# Patient Record
Sex: Male | Born: 1945 | Race: White | Hispanic: No | Marital: Married | Smoking: Never smoker
Health system: Southern US, Community
[De-identification: ages and names within clinical notes are randomized; demographics above are authoritative.]

## PROBLEM LIST (undated history)

## (undated) DIAGNOSIS — C801 Malignant (primary) neoplasm, unspecified: Secondary | ICD-10-CM

## (undated) DIAGNOSIS — R7303 Prediabetes: Secondary | ICD-10-CM

## (undated) DIAGNOSIS — R011 Cardiac murmur, unspecified: Secondary | ICD-10-CM

## (undated) DIAGNOSIS — I10 Essential (primary) hypertension: Secondary | ICD-10-CM

## (undated) HISTORY — PX: CYST EXCISION: SHX5701

## (undated) HISTORY — PX: HERNIA REPAIR: SHX51

---

## 1999-04-01 ENCOUNTER — Ambulatory Visit (HOSPITAL_BASED_OUTPATIENT_CLINIC_OR_DEPARTMENT_OTHER): Admission: RE | Admit: 1999-04-01 | Discharge: 1999-04-01 | Payer: Self-pay | Admitting: Surgery

## 2013-03-04 ENCOUNTER — Other Ambulatory Visit (HOSPITAL_COMMUNITY): Payer: Self-pay | Admitting: Urology

## 2013-03-04 DIAGNOSIS — R972 Elevated prostate specific antigen [PSA]: Secondary | ICD-10-CM

## 2013-03-18 ENCOUNTER — Ambulatory Visit (HOSPITAL_COMMUNITY)
Admission: RE | Admit: 2013-03-18 | Discharge: 2013-03-18 | Disposition: A | Discharge status: Home / Self Care | Payer: Medicare Other | Source: Ambulatory Visit | Attending: Urology | Admitting: Urology

## 2013-03-18 ENCOUNTER — Other Ambulatory Visit (HOSPITAL_COMMUNITY): Payer: Self-pay | Admitting: Urology

## 2013-03-18 DIAGNOSIS — R972 Elevated prostate specific antigen [PSA]: Secondary | ICD-10-CM | POA: Insufficient documentation

## 2013-03-18 DIAGNOSIS — N402 Nodular prostate without lower urinary tract symptoms: Secondary | ICD-10-CM | POA: Insufficient documentation

## 2013-03-18 DIAGNOSIS — Z9889 Other specified postprocedural states: Secondary | ICD-10-CM

## 2013-03-18 DIAGNOSIS — N400 Enlarged prostate without lower urinary tract symptoms: Secondary | ICD-10-CM | POA: Insufficient documentation

## 2013-03-18 DIAGNOSIS — N3289 Other specified disorders of bladder: Secondary | ICD-10-CM | POA: Insufficient documentation

## 2013-03-18 DIAGNOSIS — K5730 Diverticulosis of large intestine without perforation or abscess without bleeding: Secondary | ICD-10-CM | POA: Insufficient documentation

## 2013-03-18 LAB — POCT I-STAT, CHEM 8
Chloride: 102 mEq/L (ref 96–112)
Glucose, Bld: 97 mg/dL (ref 70–99)
Hemoglobin: 17 g/dL (ref 13.0–17.0)
Potassium: 3.8 mEq/L (ref 3.5–5.1)
Sodium: 143 mEq/L (ref 135–145)

## 2013-03-18 IMAGING — CR DG ORBITS FOR FOREIGN BODY
2 series · 2 of 2 positions shown · non-contrast
Comparison: None.

ADDENDUM:
Please note the following correction to the written report.
CLINICAL DATA: Pre MRI, question metal in eye.
CLINICAL DATA: Pre MRI, question muscle and 9.

EXAM:
ORBITS FOR FOREIGN BODY - 2 VIEW

[w waters (1 of 2)]
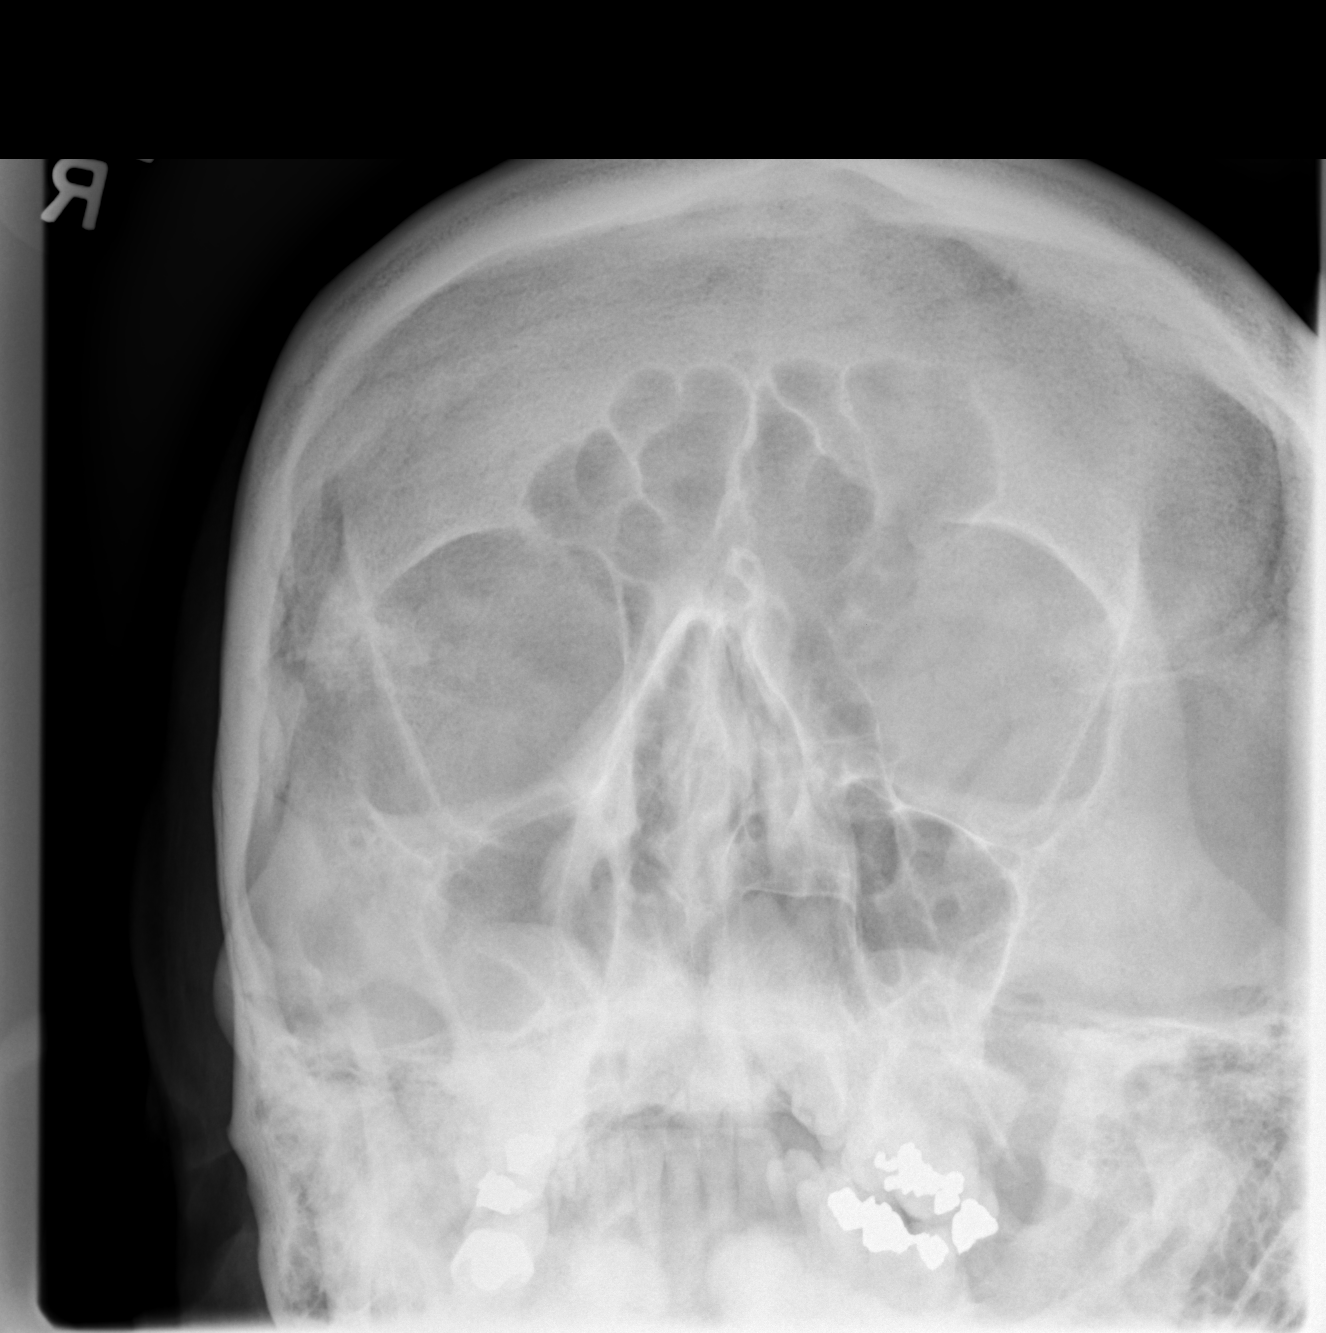

[w waters (2 of 2)]
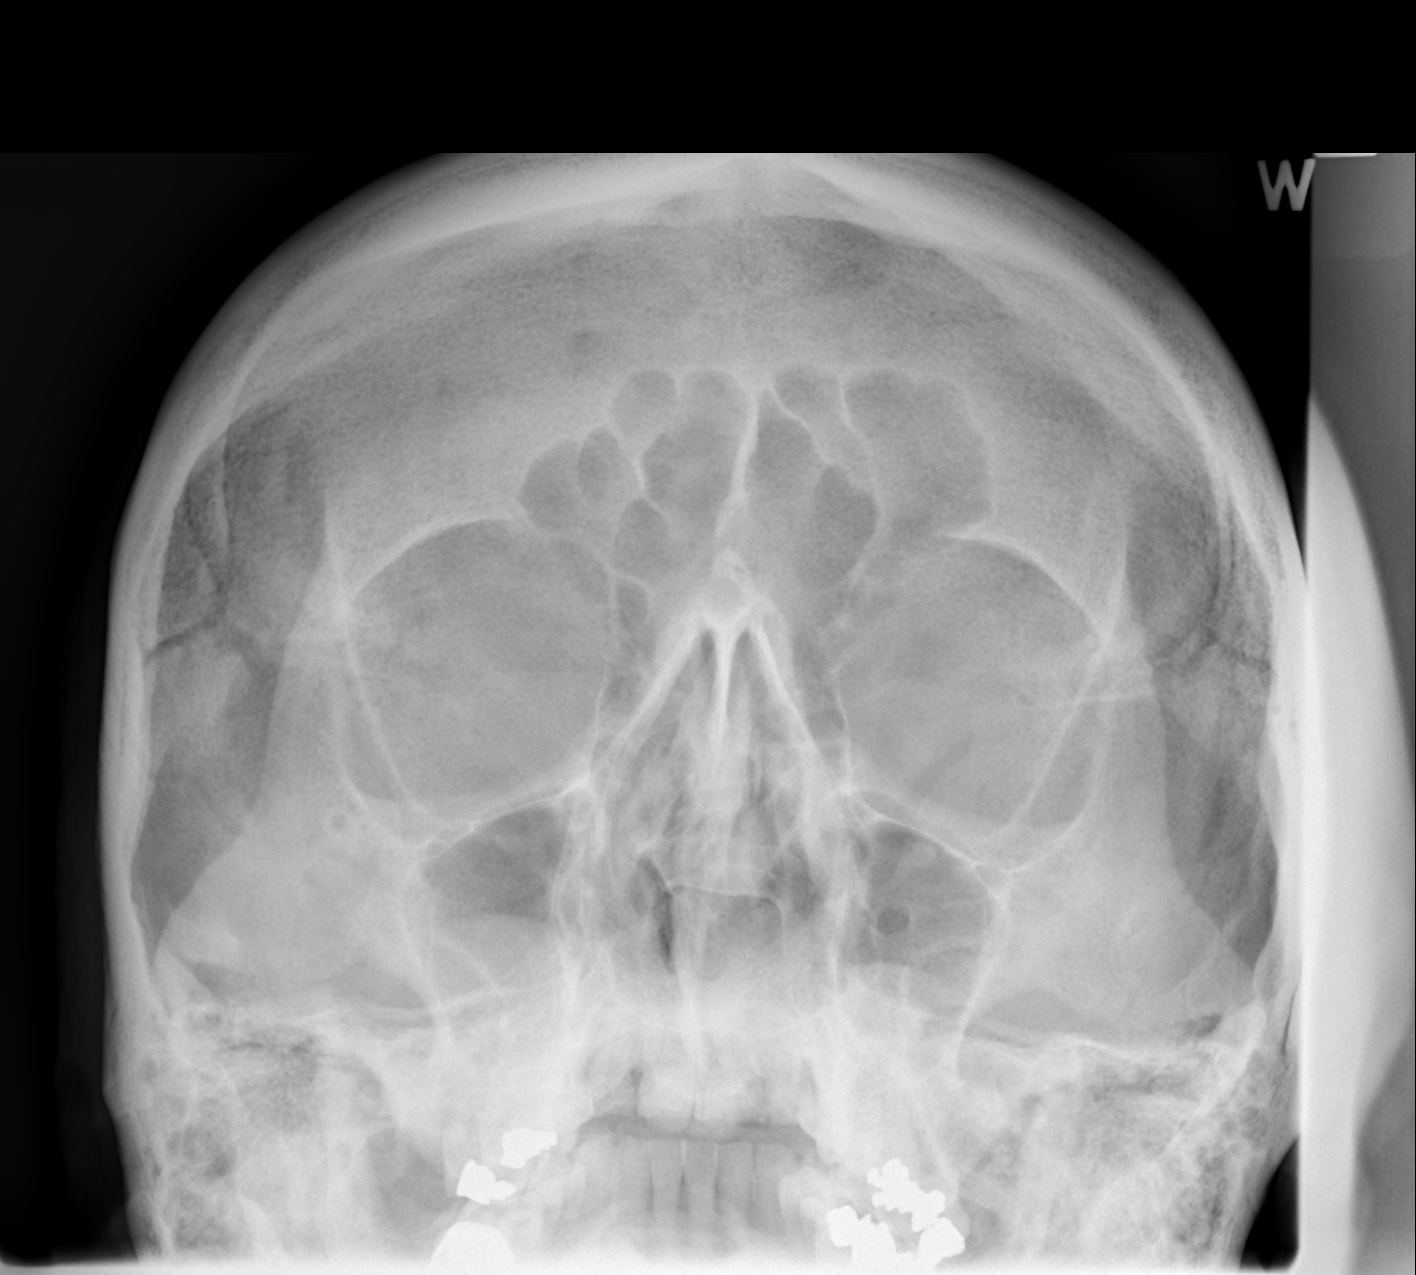

[2 of 2 positions shown; findings below may reference images not displayed]

FINDINGS: No orbital metallic foreign body.
IMPRESSION: No orbital metallic foreign body.

## 2013-03-18 MED ORDER — GADOBENATE DIMEGLUMINE 529 MG/ML IV SOLN
20.0000 mL | Freq: Once | INTRAVENOUS | Status: AC | PRN
  Administered 2013-03-18: 17 mL via INTRAVENOUS

## 2014-10-22 ENCOUNTER — Other Ambulatory Visit: Payer: Self-pay | Admitting: Urology

## 2014-10-22 DIAGNOSIS — C61 Malignant neoplasm of prostate: Secondary | ICD-10-CM

## 2014-11-10 ENCOUNTER — Ambulatory Visit (HOSPITAL_COMMUNITY)
Admission: RE | Admit: 2014-11-10 | Discharge: 2014-11-10 | Disposition: A | Discharge status: Home / Self Care | Payer: Medicare Other | Source: Ambulatory Visit | Attending: Urology | Admitting: Urology

## 2014-11-10 DIAGNOSIS — C61 Malignant neoplasm of prostate: Secondary | ICD-10-CM

## 2014-11-10 LAB — POCT I-STAT CREATININE: CREATININE: 1 mg/dL (ref 0.61–1.24)

## 2014-11-10 MED ORDER — GADOBENATE DIMEGLUMINE 529 MG/ML IV SOLN
20.0000 mL | Freq: Once | INTRAVENOUS | Status: AC | PRN
  Administered 2014-11-10: 18 mL via INTRAVENOUS

## 2015-04-05 DIAGNOSIS — N138 Other obstructive and reflux uropathy: Secondary | ICD-10-CM | POA: Insufficient documentation

## 2015-05-01 DIAGNOSIS — X32XXXD Exposure to sunlight, subsequent encounter: Secondary | ICD-10-CM | POA: Diagnosis not present

## 2015-05-01 DIAGNOSIS — Z1283 Encounter for screening for malignant neoplasm of skin: Secondary | ICD-10-CM | POA: Diagnosis not present

## 2015-05-01 DIAGNOSIS — D225 Melanocytic nevi of trunk: Secondary | ICD-10-CM | POA: Diagnosis not present

## 2015-05-01 DIAGNOSIS — L570 Actinic keratosis: Secondary | ICD-10-CM | POA: Diagnosis not present

## 2015-05-04 DIAGNOSIS — C61 Malignant neoplasm of prostate: Secondary | ICD-10-CM | POA: Diagnosis not present

## 2015-05-06 DIAGNOSIS — N401 Enlarged prostate with lower urinary tract symptoms: Secondary | ICD-10-CM | POA: Diagnosis not present

## 2015-05-06 DIAGNOSIS — Z0000 Encounter for general adult medical examination without abnormal findings: Secondary | ICD-10-CM | POA: Diagnosis not present

## 2015-05-06 DIAGNOSIS — C61 Malignant neoplasm of prostate: Secondary | ICD-10-CM | POA: Diagnosis not present

## 2015-05-06 DIAGNOSIS — N138 Other obstructive and reflux uropathy: Secondary | ICD-10-CM | POA: Diagnosis not present

## 2015-10-22 DIAGNOSIS — Z0000 Encounter for general adult medical examination without abnormal findings: Secondary | ICD-10-CM | POA: Diagnosis not present

## 2015-10-22 DIAGNOSIS — Z86010 Personal history of colonic polyps: Secondary | ICD-10-CM | POA: Diagnosis not present

## 2015-10-22 DIAGNOSIS — E7800 Pure hypercholesterolemia, unspecified: Secondary | ICD-10-CM | POA: Diagnosis not present

## 2015-10-22 DIAGNOSIS — L570 Actinic keratosis: Secondary | ICD-10-CM | POA: Diagnosis not present

## 2015-10-22 DIAGNOSIS — Z8546 Personal history of malignant neoplasm of prostate: Secondary | ICD-10-CM | POA: Diagnosis not present

## 2015-10-22 DIAGNOSIS — I10 Essential (primary) hypertension: Secondary | ICD-10-CM | POA: Diagnosis not present

## 2015-11-18 DIAGNOSIS — C61 Malignant neoplasm of prostate: Secondary | ICD-10-CM | POA: Diagnosis not present

## 2016-01-06 DIAGNOSIS — C61 Malignant neoplasm of prostate: Secondary | ICD-10-CM | POA: Diagnosis not present

## 2016-01-14 DIAGNOSIS — M5442 Lumbago with sciatica, left side: Secondary | ICD-10-CM | POA: Diagnosis not present

## 2016-01-14 DIAGNOSIS — M5136 Other intervertebral disc degeneration, lumbar region: Secondary | ICD-10-CM | POA: Diagnosis not present

## 2016-01-14 DIAGNOSIS — M9903 Segmental and somatic dysfunction of lumbar region: Secondary | ICD-10-CM | POA: Diagnosis not present

## 2016-01-14 DIAGNOSIS — M47816 Spondylosis without myelopathy or radiculopathy, lumbar region: Secondary | ICD-10-CM | POA: Diagnosis not present

## 2016-01-19 DIAGNOSIS — M47816 Spondylosis without myelopathy or radiculopathy, lumbar region: Secondary | ICD-10-CM | POA: Diagnosis not present

## 2016-01-19 DIAGNOSIS — M5442 Lumbago with sciatica, left side: Secondary | ICD-10-CM | POA: Diagnosis not present

## 2016-01-19 DIAGNOSIS — M9903 Segmental and somatic dysfunction of lumbar region: Secondary | ICD-10-CM | POA: Diagnosis not present

## 2016-01-19 DIAGNOSIS — M5136 Other intervertebral disc degeneration, lumbar region: Secondary | ICD-10-CM | POA: Diagnosis not present

## 2016-01-20 DIAGNOSIS — M545 Low back pain: Secondary | ICD-10-CM | POA: Diagnosis not present

## 2016-01-21 DIAGNOSIS — M9903 Segmental and somatic dysfunction of lumbar region: Secondary | ICD-10-CM | POA: Diagnosis not present

## 2016-01-21 DIAGNOSIS — M5442 Lumbago with sciatica, left side: Secondary | ICD-10-CM | POA: Diagnosis not present

## 2016-01-21 DIAGNOSIS — M47816 Spondylosis without myelopathy or radiculopathy, lumbar region: Secondary | ICD-10-CM | POA: Diagnosis not present

## 2016-01-21 DIAGNOSIS — M5136 Other intervertebral disc degeneration, lumbar region: Secondary | ICD-10-CM | POA: Diagnosis not present

## 2016-02-02 DIAGNOSIS — M5442 Lumbago with sciatica, left side: Secondary | ICD-10-CM | POA: Diagnosis not present

## 2016-02-02 DIAGNOSIS — M9903 Segmental and somatic dysfunction of lumbar region: Secondary | ICD-10-CM | POA: Diagnosis not present

## 2016-02-02 DIAGNOSIS — M5136 Other intervertebral disc degeneration, lumbar region: Secondary | ICD-10-CM | POA: Diagnosis not present

## 2016-02-02 DIAGNOSIS — M47816 Spondylosis without myelopathy or radiculopathy, lumbar region: Secondary | ICD-10-CM | POA: Diagnosis not present

## 2016-02-04 DIAGNOSIS — M9903 Segmental and somatic dysfunction of lumbar region: Secondary | ICD-10-CM | POA: Diagnosis not present

## 2016-02-04 DIAGNOSIS — M5442 Lumbago with sciatica, left side: Secondary | ICD-10-CM | POA: Diagnosis not present

## 2016-02-04 DIAGNOSIS — M47816 Spondylosis without myelopathy or radiculopathy, lumbar region: Secondary | ICD-10-CM | POA: Diagnosis not present

## 2016-02-04 DIAGNOSIS — Z23 Encounter for immunization: Secondary | ICD-10-CM | POA: Diagnosis not present

## 2016-02-04 DIAGNOSIS — M5136 Other intervertebral disc degeneration, lumbar region: Secondary | ICD-10-CM | POA: Diagnosis not present

## 2016-02-09 DIAGNOSIS — M47812 Spondylosis without myelopathy or radiculopathy, cervical region: Secondary | ICD-10-CM | POA: Diagnosis not present

## 2016-02-09 DIAGNOSIS — M5030 Other cervical disc degeneration, unspecified cervical region: Secondary | ICD-10-CM | POA: Diagnosis not present

## 2016-02-09 DIAGNOSIS — M542 Cervicalgia: Secondary | ICD-10-CM | POA: Diagnosis not present

## 2016-02-09 DIAGNOSIS — M9901 Segmental and somatic dysfunction of cervical region: Secondary | ICD-10-CM | POA: Diagnosis not present

## 2016-02-11 DIAGNOSIS — M47812 Spondylosis without myelopathy or radiculopathy, cervical region: Secondary | ICD-10-CM | POA: Diagnosis not present

## 2016-02-11 DIAGNOSIS — M542 Cervicalgia: Secondary | ICD-10-CM | POA: Diagnosis not present

## 2016-02-11 DIAGNOSIS — M5030 Other cervical disc degeneration, unspecified cervical region: Secondary | ICD-10-CM | POA: Diagnosis not present

## 2016-02-11 DIAGNOSIS — M9901 Segmental and somatic dysfunction of cervical region: Secondary | ICD-10-CM | POA: Diagnosis not present

## 2016-02-22 DIAGNOSIS — M47812 Spondylosis without myelopathy or radiculopathy, cervical region: Secondary | ICD-10-CM | POA: Diagnosis not present

## 2016-02-22 DIAGNOSIS — M542 Cervicalgia: Secondary | ICD-10-CM | POA: Diagnosis not present

## 2016-02-22 DIAGNOSIS — M5030 Other cervical disc degeneration, unspecified cervical region: Secondary | ICD-10-CM | POA: Diagnosis not present

## 2016-02-22 DIAGNOSIS — M9901 Segmental and somatic dysfunction of cervical region: Secondary | ICD-10-CM | POA: Diagnosis not present

## 2016-02-23 DIAGNOSIS — M5030 Other cervical disc degeneration, unspecified cervical region: Secondary | ICD-10-CM | POA: Diagnosis not present

## 2016-02-23 DIAGNOSIS — M542 Cervicalgia: Secondary | ICD-10-CM | POA: Diagnosis not present

## 2016-02-23 DIAGNOSIS — M47812 Spondylosis without myelopathy or radiculopathy, cervical region: Secondary | ICD-10-CM | POA: Diagnosis not present

## 2016-02-23 DIAGNOSIS — M9901 Segmental and somatic dysfunction of cervical region: Secondary | ICD-10-CM | POA: Diagnosis not present

## 2016-02-24 DIAGNOSIS — M542 Cervicalgia: Secondary | ICD-10-CM | POA: Diagnosis not present

## 2016-02-24 DIAGNOSIS — M9901 Segmental and somatic dysfunction of cervical region: Secondary | ICD-10-CM | POA: Diagnosis not present

## 2016-02-24 DIAGNOSIS — M5030 Other cervical disc degeneration, unspecified cervical region: Secondary | ICD-10-CM | POA: Diagnosis not present

## 2016-02-24 DIAGNOSIS — M47812 Spondylosis without myelopathy or radiculopathy, cervical region: Secondary | ICD-10-CM | POA: Diagnosis not present

## 2016-02-29 DIAGNOSIS — M9901 Segmental and somatic dysfunction of cervical region: Secondary | ICD-10-CM | POA: Diagnosis not present

## 2016-02-29 DIAGNOSIS — M542 Cervicalgia: Secondary | ICD-10-CM | POA: Diagnosis not present

## 2016-02-29 DIAGNOSIS — M5030 Other cervical disc degeneration, unspecified cervical region: Secondary | ICD-10-CM | POA: Diagnosis not present

## 2016-02-29 DIAGNOSIS — M47812 Spondylosis without myelopathy or radiculopathy, cervical region: Secondary | ICD-10-CM | POA: Diagnosis not present

## 2016-03-02 DIAGNOSIS — M542 Cervicalgia: Secondary | ICD-10-CM | POA: Diagnosis not present

## 2016-03-02 DIAGNOSIS — M5030 Other cervical disc degeneration, unspecified cervical region: Secondary | ICD-10-CM | POA: Diagnosis not present

## 2016-03-02 DIAGNOSIS — M47812 Spondylosis without myelopathy or radiculopathy, cervical region: Secondary | ICD-10-CM | POA: Diagnosis not present

## 2016-03-02 DIAGNOSIS — M9901 Segmental and somatic dysfunction of cervical region: Secondary | ICD-10-CM | POA: Diagnosis not present

## 2016-03-09 DIAGNOSIS — M542 Cervicalgia: Secondary | ICD-10-CM | POA: Diagnosis not present

## 2016-03-09 DIAGNOSIS — M47812 Spondylosis without myelopathy or radiculopathy, cervical region: Secondary | ICD-10-CM | POA: Diagnosis not present

## 2016-03-09 DIAGNOSIS — M9901 Segmental and somatic dysfunction of cervical region: Secondary | ICD-10-CM | POA: Diagnosis not present

## 2016-03-09 DIAGNOSIS — M5030 Other cervical disc degeneration, unspecified cervical region: Secondary | ICD-10-CM | POA: Diagnosis not present

## 2016-03-15 DIAGNOSIS — M47812 Spondylosis without myelopathy or radiculopathy, cervical region: Secondary | ICD-10-CM | POA: Diagnosis not present

## 2016-03-15 DIAGNOSIS — M542 Cervicalgia: Secondary | ICD-10-CM | POA: Diagnosis not present

## 2016-03-15 DIAGNOSIS — M5030 Other cervical disc degeneration, unspecified cervical region: Secondary | ICD-10-CM | POA: Diagnosis not present

## 2016-03-15 DIAGNOSIS — M9901 Segmental and somatic dysfunction of cervical region: Secondary | ICD-10-CM | POA: Diagnosis not present

## 2016-03-18 DIAGNOSIS — H2513 Age-related nuclear cataract, bilateral: Secondary | ICD-10-CM | POA: Diagnosis not present

## 2016-03-18 DIAGNOSIS — Z0100 Encounter for examination of eyes and vision without abnormal findings: Secondary | ICD-10-CM | POA: Diagnosis not present

## 2016-04-12 DIAGNOSIS — M47812 Spondylosis without myelopathy or radiculopathy, cervical region: Secondary | ICD-10-CM | POA: Diagnosis not present

## 2016-04-12 DIAGNOSIS — M5030 Other cervical disc degeneration, unspecified cervical region: Secondary | ICD-10-CM | POA: Diagnosis not present

## 2016-04-12 DIAGNOSIS — M542 Cervicalgia: Secondary | ICD-10-CM | POA: Diagnosis not present

## 2016-04-12 DIAGNOSIS — M9901 Segmental and somatic dysfunction of cervical region: Secondary | ICD-10-CM | POA: Diagnosis not present

## 2016-05-17 DIAGNOSIS — M542 Cervicalgia: Secondary | ICD-10-CM | POA: Diagnosis not present

## 2016-05-17 DIAGNOSIS — M5030 Other cervical disc degeneration, unspecified cervical region: Secondary | ICD-10-CM | POA: Diagnosis not present

## 2016-05-17 DIAGNOSIS — M9901 Segmental and somatic dysfunction of cervical region: Secondary | ICD-10-CM | POA: Diagnosis not present

## 2016-05-17 DIAGNOSIS — M47812 Spondylosis without myelopathy or radiculopathy, cervical region: Secondary | ICD-10-CM | POA: Diagnosis not present

## 2016-06-14 DIAGNOSIS — M5030 Other cervical disc degeneration, unspecified cervical region: Secondary | ICD-10-CM | POA: Diagnosis not present

## 2016-06-14 DIAGNOSIS — M542 Cervicalgia: Secondary | ICD-10-CM | POA: Diagnosis not present

## 2016-06-14 DIAGNOSIS — M9901 Segmental and somatic dysfunction of cervical region: Secondary | ICD-10-CM | POA: Diagnosis not present

## 2016-06-14 DIAGNOSIS — M47812 Spondylosis without myelopathy or radiculopathy, cervical region: Secondary | ICD-10-CM | POA: Diagnosis not present

## 2016-07-12 DIAGNOSIS — M5030 Other cervical disc degeneration, unspecified cervical region: Secondary | ICD-10-CM | POA: Diagnosis not present

## 2016-07-12 DIAGNOSIS — M9901 Segmental and somatic dysfunction of cervical region: Secondary | ICD-10-CM | POA: Diagnosis not present

## 2016-07-12 DIAGNOSIS — M47812 Spondylosis without myelopathy or radiculopathy, cervical region: Secondary | ICD-10-CM | POA: Diagnosis not present

## 2016-07-12 DIAGNOSIS — M542 Cervicalgia: Secondary | ICD-10-CM | POA: Diagnosis not present

## 2016-07-27 DIAGNOSIS — C61 Malignant neoplasm of prostate: Secondary | ICD-10-CM | POA: Diagnosis not present

## 2016-08-03 DIAGNOSIS — C61 Malignant neoplasm of prostate: Secondary | ICD-10-CM | POA: Diagnosis not present

## 2016-08-03 DIAGNOSIS — R3911 Hesitancy of micturition: Secondary | ICD-10-CM | POA: Diagnosis not present

## 2016-08-03 DIAGNOSIS — N401 Enlarged prostate with lower urinary tract symptoms: Secondary | ICD-10-CM | POA: Diagnosis not present

## 2016-08-09 DIAGNOSIS — M5030 Other cervical disc degeneration, unspecified cervical region: Secondary | ICD-10-CM | POA: Diagnosis not present

## 2016-08-09 DIAGNOSIS — M542 Cervicalgia: Secondary | ICD-10-CM | POA: Diagnosis not present

## 2016-08-09 DIAGNOSIS — M9901 Segmental and somatic dysfunction of cervical region: Secondary | ICD-10-CM | POA: Diagnosis not present

## 2016-08-09 DIAGNOSIS — M47812 Spondylosis without myelopathy or radiculopathy, cervical region: Secondary | ICD-10-CM | POA: Diagnosis not present

## 2016-09-07 DIAGNOSIS — M47812 Spondylosis without myelopathy or radiculopathy, cervical region: Secondary | ICD-10-CM | POA: Diagnosis not present

## 2016-09-07 DIAGNOSIS — M9901 Segmental and somatic dysfunction of cervical region: Secondary | ICD-10-CM | POA: Diagnosis not present

## 2016-09-07 DIAGNOSIS — M542 Cervicalgia: Secondary | ICD-10-CM | POA: Diagnosis not present

## 2016-09-07 DIAGNOSIS — M5030 Other cervical disc degeneration, unspecified cervical region: Secondary | ICD-10-CM | POA: Diagnosis not present

## 2016-10-04 DIAGNOSIS — M542 Cervicalgia: Secondary | ICD-10-CM | POA: Diagnosis not present

## 2016-10-04 DIAGNOSIS — M47812 Spondylosis without myelopathy or radiculopathy, cervical region: Secondary | ICD-10-CM | POA: Diagnosis not present

## 2016-10-04 DIAGNOSIS — M5030 Other cervical disc degeneration, unspecified cervical region: Secondary | ICD-10-CM | POA: Diagnosis not present

## 2016-10-04 DIAGNOSIS — M9901 Segmental and somatic dysfunction of cervical region: Secondary | ICD-10-CM | POA: Diagnosis not present

## 2016-10-28 DIAGNOSIS — E7800 Pure hypercholesterolemia, unspecified: Secondary | ICD-10-CM | POA: Diagnosis not present

## 2016-10-28 DIAGNOSIS — I10 Essential (primary) hypertension: Secondary | ICD-10-CM | POA: Diagnosis not present

## 2016-11-02 DIAGNOSIS — E7800 Pure hypercholesterolemia, unspecified: Secondary | ICD-10-CM | POA: Diagnosis not present

## 2016-11-02 DIAGNOSIS — Z6829 Body mass index (BMI) 29.0-29.9, adult: Secondary | ICD-10-CM | POA: Diagnosis not present

## 2016-11-02 DIAGNOSIS — E663 Overweight: Secondary | ICD-10-CM | POA: Diagnosis not present

## 2016-11-02 DIAGNOSIS — Z86010 Personal history of colonic polyps: Secondary | ICD-10-CM | POA: Diagnosis not present

## 2016-11-02 DIAGNOSIS — L570 Actinic keratosis: Secondary | ICD-10-CM | POA: Diagnosis not present

## 2016-11-02 DIAGNOSIS — Z8546 Personal history of malignant neoplasm of prostate: Secondary | ICD-10-CM | POA: Diagnosis not present

## 2016-11-02 DIAGNOSIS — Z0000 Encounter for general adult medical examination without abnormal findings: Secondary | ICD-10-CM | POA: Diagnosis not present

## 2016-11-02 DIAGNOSIS — I10 Essential (primary) hypertension: Secondary | ICD-10-CM | POA: Diagnosis not present

## 2016-11-08 DIAGNOSIS — M9901 Segmental and somatic dysfunction of cervical region: Secondary | ICD-10-CM | POA: Diagnosis not present

## 2016-11-08 DIAGNOSIS — M542 Cervicalgia: Secondary | ICD-10-CM | POA: Diagnosis not present

## 2016-11-08 DIAGNOSIS — M47812 Spondylosis without myelopathy or radiculopathy, cervical region: Secondary | ICD-10-CM | POA: Diagnosis not present

## 2016-11-08 DIAGNOSIS — M5030 Other cervical disc degeneration, unspecified cervical region: Secondary | ICD-10-CM | POA: Diagnosis not present

## 2016-12-06 DIAGNOSIS — M5030 Other cervical disc degeneration, unspecified cervical region: Secondary | ICD-10-CM | POA: Diagnosis not present

## 2016-12-06 DIAGNOSIS — M9901 Segmental and somatic dysfunction of cervical region: Secondary | ICD-10-CM | POA: Diagnosis not present

## 2016-12-06 DIAGNOSIS — M47812 Spondylosis without myelopathy or radiculopathy, cervical region: Secondary | ICD-10-CM | POA: Diagnosis not present

## 2016-12-06 DIAGNOSIS — M542 Cervicalgia: Secondary | ICD-10-CM | POA: Diagnosis not present

## 2017-01-03 DIAGNOSIS — M47812 Spondylosis without myelopathy or radiculopathy, cervical region: Secondary | ICD-10-CM | POA: Diagnosis not present

## 2017-01-03 DIAGNOSIS — M5030 Other cervical disc degeneration, unspecified cervical region: Secondary | ICD-10-CM | POA: Diagnosis not present

## 2017-01-03 DIAGNOSIS — M542 Cervicalgia: Secondary | ICD-10-CM | POA: Diagnosis not present

## 2017-01-03 DIAGNOSIS — M9901 Segmental and somatic dysfunction of cervical region: Secondary | ICD-10-CM | POA: Diagnosis not present

## 2017-01-31 DIAGNOSIS — C61 Malignant neoplasm of prostate: Secondary | ICD-10-CM | POA: Diagnosis not present

## 2017-02-07 DIAGNOSIS — R3912 Poor urinary stream: Secondary | ICD-10-CM | POA: Diagnosis not present

## 2017-02-07 DIAGNOSIS — C61 Malignant neoplasm of prostate: Secondary | ICD-10-CM | POA: Diagnosis not present

## 2017-02-07 DIAGNOSIS — N401 Enlarged prostate with lower urinary tract symptoms: Secondary | ICD-10-CM | POA: Diagnosis not present

## 2017-02-08 ENCOUNTER — Other Ambulatory Visit: Payer: Self-pay | Admitting: Urology

## 2017-02-08 DIAGNOSIS — C61 Malignant neoplasm of prostate: Secondary | ICD-10-CM

## 2017-02-22 ENCOUNTER — Ambulatory Visit
Admission: RE | Admit: 2017-02-22 | Discharge: 2017-02-22 | Disposition: A | Discharge status: Home / Self Care | Payer: PPO | Source: Ambulatory Visit | Attending: Urology | Admitting: Urology

## 2017-02-22 DIAGNOSIS — C61 Malignant neoplasm of prostate: Secondary | ICD-10-CM

## 2017-02-22 MED ORDER — GADOBENATE DIMEGLUMINE 529 MG/ML IV SOLN
15.0000 mL | Freq: Once | INTRAVENOUS | Status: AC | PRN
  Administered 2017-02-22: 15 mL via INTRAVENOUS

## 2017-03-17 DIAGNOSIS — H524 Presbyopia: Secondary | ICD-10-CM | POA: Diagnosis not present

## 2017-03-17 DIAGNOSIS — H5203 Hypermetropia, bilateral: Secondary | ICD-10-CM | POA: Diagnosis not present

## 2017-03-17 DIAGNOSIS — H2513 Age-related nuclear cataract, bilateral: Secondary | ICD-10-CM | POA: Diagnosis not present

## 2017-03-21 DIAGNOSIS — C61 Malignant neoplasm of prostate: Secondary | ICD-10-CM | POA: Diagnosis not present

## 2017-08-02 DIAGNOSIS — C61 Malignant neoplasm of prostate: Secondary | ICD-10-CM | POA: Diagnosis not present

## 2017-08-09 DIAGNOSIS — C61 Malignant neoplasm of prostate: Secondary | ICD-10-CM | POA: Diagnosis not present

## 2017-08-09 DIAGNOSIS — N401 Enlarged prostate with lower urinary tract symptoms: Secondary | ICD-10-CM | POA: Diagnosis not present

## 2017-08-09 DIAGNOSIS — R3912 Poor urinary stream: Secondary | ICD-10-CM | POA: Diagnosis not present

## 2017-10-31 DIAGNOSIS — I10 Essential (primary) hypertension: Secondary | ICD-10-CM | POA: Diagnosis not present

## 2017-11-08 DIAGNOSIS — I10 Essential (primary) hypertension: Secondary | ICD-10-CM | POA: Diagnosis not present

## 2017-11-08 DIAGNOSIS — Z1331 Encounter for screening for depression: Secondary | ICD-10-CM | POA: Diagnosis not present

## 2017-11-08 DIAGNOSIS — E7800 Pure hypercholesterolemia, unspecified: Secondary | ICD-10-CM | POA: Diagnosis not present

## 2017-11-08 DIAGNOSIS — Z0000 Encounter for general adult medical examination without abnormal findings: Secondary | ICD-10-CM | POA: Diagnosis not present

## 2017-11-08 DIAGNOSIS — Z86010 Personal history of colonic polyps: Secondary | ICD-10-CM | POA: Diagnosis not present

## 2017-11-08 DIAGNOSIS — L570 Actinic keratosis: Secondary | ICD-10-CM | POA: Diagnosis not present

## 2017-11-08 DIAGNOSIS — Z1389 Encounter for screening for other disorder: Secondary | ICD-10-CM | POA: Diagnosis not present

## 2017-11-08 DIAGNOSIS — R7301 Impaired fasting glucose: Secondary | ICD-10-CM | POA: Diagnosis not present

## 2017-11-08 DIAGNOSIS — Z8546 Personal history of malignant neoplasm of prostate: Secondary | ICD-10-CM | POA: Diagnosis not present

## 2017-12-01 DIAGNOSIS — L570 Actinic keratosis: Secondary | ICD-10-CM | POA: Diagnosis not present

## 2017-12-01 DIAGNOSIS — L980 Pyogenic granuloma: Secondary | ICD-10-CM | POA: Diagnosis not present

## 2017-12-01 DIAGNOSIS — X32XXXD Exposure to sunlight, subsequent encounter: Secondary | ICD-10-CM | POA: Diagnosis not present

## 2017-12-01 DIAGNOSIS — D225 Melanocytic nevi of trunk: Secondary | ICD-10-CM | POA: Diagnosis not present

## 2017-12-01 DIAGNOSIS — Z1283 Encounter for screening for malignant neoplasm of skin: Secondary | ICD-10-CM | POA: Diagnosis not present

## 2018-02-05 DIAGNOSIS — C61 Malignant neoplasm of prostate: Secondary | ICD-10-CM | POA: Diagnosis not present

## 2018-02-08 DIAGNOSIS — N401 Enlarged prostate with lower urinary tract symptoms: Secondary | ICD-10-CM | POA: Diagnosis not present

## 2018-02-08 DIAGNOSIS — R3912 Poor urinary stream: Secondary | ICD-10-CM | POA: Diagnosis not present

## 2018-02-08 DIAGNOSIS — C61 Malignant neoplasm of prostate: Secondary | ICD-10-CM | POA: Diagnosis not present

## 2018-07-31 DIAGNOSIS — C61 Malignant neoplasm of prostate: Secondary | ICD-10-CM | POA: Diagnosis not present

## 2018-08-07 DIAGNOSIS — N401 Enlarged prostate with lower urinary tract symptoms: Secondary | ICD-10-CM | POA: Diagnosis not present

## 2018-08-07 DIAGNOSIS — C61 Malignant neoplasm of prostate: Secondary | ICD-10-CM | POA: Diagnosis not present

## 2018-08-07 DIAGNOSIS — R3912 Poor urinary stream: Secondary | ICD-10-CM | POA: Diagnosis not present

## 2018-11-26 DIAGNOSIS — I10 Essential (primary) hypertension: Secondary | ICD-10-CM | POA: Diagnosis not present

## 2018-11-26 DIAGNOSIS — Z1159 Encounter for screening for other viral diseases: Secondary | ICD-10-CM | POA: Diagnosis not present

## 2018-11-26 DIAGNOSIS — R7301 Impaired fasting glucose: Secondary | ICD-10-CM | POA: Diagnosis not present

## 2018-11-29 DIAGNOSIS — I10 Essential (primary) hypertension: Secondary | ICD-10-CM | POA: Diagnosis not present

## 2018-11-29 DIAGNOSIS — L570 Actinic keratosis: Secondary | ICD-10-CM | POA: Diagnosis not present

## 2018-11-29 DIAGNOSIS — Z86010 Personal history of colonic polyps: Secondary | ICD-10-CM | POA: Diagnosis not present

## 2018-11-29 DIAGNOSIS — R7301 Impaired fasting glucose: Secondary | ICD-10-CM | POA: Diagnosis not present

## 2018-11-29 DIAGNOSIS — Z0000 Encounter for general adult medical examination without abnormal findings: Secondary | ICD-10-CM | POA: Diagnosis not present

## 2018-11-29 DIAGNOSIS — Z8546 Personal history of malignant neoplasm of prostate: Secondary | ICD-10-CM | POA: Diagnosis not present

## 2018-11-29 DIAGNOSIS — E7800 Pure hypercholesterolemia, unspecified: Secondary | ICD-10-CM | POA: Diagnosis not present

## 2018-11-29 DIAGNOSIS — Z1159 Encounter for screening for other viral diseases: Secondary | ICD-10-CM | POA: Diagnosis not present

## 2018-12-18 DIAGNOSIS — L980 Pyogenic granuloma: Secondary | ICD-10-CM | POA: Diagnosis not present

## 2018-12-18 DIAGNOSIS — D225 Melanocytic nevi of trunk: Secondary | ICD-10-CM | POA: Diagnosis not present

## 2018-12-18 DIAGNOSIS — X32XXXD Exposure to sunlight, subsequent encounter: Secondary | ICD-10-CM | POA: Diagnosis not present

## 2018-12-18 DIAGNOSIS — L820 Inflamed seborrheic keratosis: Secondary | ICD-10-CM | POA: Diagnosis not present

## 2018-12-18 DIAGNOSIS — Z1283 Encounter for screening for malignant neoplasm of skin: Secondary | ICD-10-CM | POA: Diagnosis not present

## 2018-12-18 DIAGNOSIS — L570 Actinic keratosis: Secondary | ICD-10-CM | POA: Diagnosis not present

## 2018-12-18 DIAGNOSIS — L821 Other seborrheic keratosis: Secondary | ICD-10-CM | POA: Diagnosis not present

## 2019-01-03 DIAGNOSIS — Z23 Encounter for immunization: Secondary | ICD-10-CM | POA: Diagnosis not present

## 2019-01-08 DIAGNOSIS — L859 Epidermal thickening, unspecified: Secondary | ICD-10-CM | POA: Diagnosis not present

## 2019-01-08 DIAGNOSIS — L570 Actinic keratosis: Secondary | ICD-10-CM | POA: Diagnosis not present

## 2019-01-08 DIAGNOSIS — X32XXXD Exposure to sunlight, subsequent encounter: Secondary | ICD-10-CM | POA: Diagnosis not present

## 2019-01-08 DIAGNOSIS — C44219 Basal cell carcinoma of skin of left ear and external auricular canal: Secondary | ICD-10-CM | POA: Diagnosis not present

## 2019-01-18 DIAGNOSIS — L980 Pyogenic granuloma: Secondary | ICD-10-CM | POA: Diagnosis not present

## 2019-01-30 DIAGNOSIS — C61 Malignant neoplasm of prostate: Secondary | ICD-10-CM | POA: Diagnosis not present

## 2019-02-05 DIAGNOSIS — Z08 Encounter for follow-up examination after completed treatment for malignant neoplasm: Secondary | ICD-10-CM | POA: Diagnosis not present

## 2019-02-05 DIAGNOSIS — Z85828 Personal history of other malignant neoplasm of skin: Secondary | ICD-10-CM | POA: Diagnosis not present

## 2019-02-06 DIAGNOSIS — R3912 Poor urinary stream: Secondary | ICD-10-CM | POA: Diagnosis not present

## 2019-02-06 DIAGNOSIS — N401 Enlarged prostate with lower urinary tract symptoms: Secondary | ICD-10-CM | POA: Diagnosis not present

## 2019-02-06 DIAGNOSIS — C61 Malignant neoplasm of prostate: Secondary | ICD-10-CM | POA: Diagnosis not present

## 2019-04-08 DIAGNOSIS — H2513 Age-related nuclear cataract, bilateral: Secondary | ICD-10-CM | POA: Diagnosis not present

## 2019-04-08 DIAGNOSIS — E119 Type 2 diabetes mellitus without complications: Secondary | ICD-10-CM | POA: Diagnosis not present

## 2019-04-25 ENCOUNTER — Ambulatory Visit: Payer: PPO | Attending: Internal Medicine

## 2019-04-25 DIAGNOSIS — Z23 Encounter for immunization: Secondary | ICD-10-CM | POA: Insufficient documentation

## 2019-04-25 NOTE — Progress Notes (Signed)
   Covid-19 Vaccination Clinic  Name:  James Pace    MRN: [id number] DOB: [birth year]  04/25/2019  Mr. James Pace was observed post Covid-19 immunization for 15 minutes without incidence. He was provided with Vaccine Information Sheet and instruction to access the V-Safe system.   Mr. James Pace was instructed to call 911 with any severe reactions post vaccine: Marland Kitchen Difficulty breathing  . Swelling of your face and throat  . A fast heartbeat  . A bad rash all over your body  . Dizziness and weakness    Immunizations Administered    Name Date Dose VIS Date Route   Pfizer COVID-19 Vaccine 04/25/2019 10:02 AM 0.3 mL 03/15/2019 Intramuscular   Manufacturer: [name]   Lot: [id number]   [place]: [id number]

## 2019-05-16 ENCOUNTER — Ambulatory Visit: Payer: PPO | Attending: Internal Medicine

## 2019-05-16 DIAGNOSIS — Z23 Encounter for immunization: Secondary | ICD-10-CM | POA: Insufficient documentation

## 2019-05-16 NOTE — Progress Notes (Signed)
   Covid-19 Vaccination Clinic  Name:  James Pace    MRN: [id number] DOB: 04/23/1945  05/16/2019  [name] was observed post Covid-19 immunization for 15 minutes without incidence. He was provided with Vaccine Information Sheet and instruction to access the V-Safe system.   [name] was instructed to call 911 with any severe reactions post vaccine: Marland Kitchen Difficulty breathing  . Swelling of your face and throat  . A fast heartbeat  . A bad rash all over your body  . Dizziness and weakness    Immunizations Administered    Name Date Dose VIS Date Route   Pfizer COVID-19 Vaccine 05/16/2019  9:54 AM 0.3 mL 03/15/2019 Intramuscular   Manufacturer: [place]   Lot: [id number]   [place]: [id number]

## 2019-07-31 DIAGNOSIS — C61 Malignant neoplasm of prostate: Secondary | ICD-10-CM | POA: Diagnosis not present

## 2019-08-07 DIAGNOSIS — N401 Enlarged prostate with lower urinary tract symptoms: Secondary | ICD-10-CM | POA: Diagnosis not present

## 2019-08-07 DIAGNOSIS — R3912 Poor urinary stream: Secondary | ICD-10-CM | POA: Diagnosis not present

## 2019-08-07 DIAGNOSIS — C61 Malignant neoplasm of prostate: Secondary | ICD-10-CM | POA: Diagnosis not present

## 2019-12-11 DIAGNOSIS — Z23 Encounter for immunization: Secondary | ICD-10-CM | POA: Diagnosis not present

## 2019-12-11 DIAGNOSIS — L570 Actinic keratosis: Secondary | ICD-10-CM | POA: Diagnosis not present

## 2019-12-11 DIAGNOSIS — Z8546 Personal history of malignant neoplasm of prostate: Secondary | ICD-10-CM | POA: Diagnosis not present

## 2019-12-11 DIAGNOSIS — R7301 Impaired fasting glucose: Secondary | ICD-10-CM | POA: Diagnosis not present

## 2019-12-11 DIAGNOSIS — I10 Essential (primary) hypertension: Secondary | ICD-10-CM | POA: Diagnosis not present

## 2019-12-11 DIAGNOSIS — Z86010 Personal history of colonic polyps: Secondary | ICD-10-CM | POA: Diagnosis not present

## 2019-12-11 DIAGNOSIS — Z0000 Encounter for general adult medical examination without abnormal findings: Secondary | ICD-10-CM | POA: Diagnosis not present

## 2019-12-11 DIAGNOSIS — E7800 Pure hypercholesterolemia, unspecified: Secondary | ICD-10-CM | POA: Diagnosis not present

## 2020-01-21 DIAGNOSIS — Z23 Encounter for immunization: Secondary | ICD-10-CM | POA: Diagnosis not present

## 2020-01-28 DIAGNOSIS — C61 Malignant neoplasm of prostate: Secondary | ICD-10-CM | POA: Diagnosis not present

## 2020-01-31 DIAGNOSIS — Z1159 Encounter for screening for other viral diseases: Secondary | ICD-10-CM | POA: Diagnosis not present

## 2020-02-04 DIAGNOSIS — R3912 Poor urinary stream: Secondary | ICD-10-CM | POA: Diagnosis not present

## 2020-02-04 DIAGNOSIS — N401 Enlarged prostate with lower urinary tract symptoms: Secondary | ICD-10-CM | POA: Diagnosis not present

## 2020-02-04 DIAGNOSIS — C61 Malignant neoplasm of prostate: Secondary | ICD-10-CM | POA: Diagnosis not present

## 2020-02-05 DIAGNOSIS — D123 Benign neoplasm of transverse colon: Secondary | ICD-10-CM | POA: Diagnosis not present

## 2020-02-05 DIAGNOSIS — K5730 Diverticulosis of large intestine without perforation or abscess without bleeding: Secondary | ICD-10-CM | POA: Diagnosis not present

## 2020-02-05 DIAGNOSIS — D120 Benign neoplasm of cecum: Secondary | ICD-10-CM | POA: Diagnosis not present

## 2020-02-05 DIAGNOSIS — Z86010 Personal history of colonic polyps: Secondary | ICD-10-CM | POA: Diagnosis not present

## 2020-02-07 DIAGNOSIS — D123 Benign neoplasm of transverse colon: Secondary | ICD-10-CM | POA: Diagnosis not present

## 2020-02-07 DIAGNOSIS — D120 Benign neoplasm of cecum: Secondary | ICD-10-CM | POA: Diagnosis not present

## 2020-04-09 DIAGNOSIS — E119 Type 2 diabetes mellitus without complications: Secondary | ICD-10-CM | POA: Diagnosis not present

## 2020-04-09 DIAGNOSIS — H524 Presbyopia: Secondary | ICD-10-CM | POA: Diagnosis not present

## 2020-04-09 DIAGNOSIS — H5203 Hypermetropia, bilateral: Secondary | ICD-10-CM | POA: Diagnosis not present

## 2020-04-09 DIAGNOSIS — H2513 Age-related nuclear cataract, bilateral: Secondary | ICD-10-CM | POA: Diagnosis not present

## 2020-07-30 DIAGNOSIS — C61 Malignant neoplasm of prostate: Secondary | ICD-10-CM | POA: Diagnosis not present

## 2020-08-06 DIAGNOSIS — R3912 Poor urinary stream: Secondary | ICD-10-CM | POA: Diagnosis not present

## 2020-08-06 DIAGNOSIS — N401 Enlarged prostate with lower urinary tract symptoms: Secondary | ICD-10-CM | POA: Diagnosis not present

## 2020-08-06 DIAGNOSIS — C61 Malignant neoplasm of prostate: Secondary | ICD-10-CM | POA: Diagnosis not present

## 2020-09-07 DIAGNOSIS — R7301 Impaired fasting glucose: Secondary | ICD-10-CM | POA: Diagnosis not present

## 2020-09-07 DIAGNOSIS — I10 Essential (primary) hypertension: Secondary | ICD-10-CM | POA: Diagnosis not present

## 2020-09-07 DIAGNOSIS — E7800 Pure hypercholesterolemia, unspecified: Secondary | ICD-10-CM | POA: Diagnosis not present

## 2020-10-14 DIAGNOSIS — D225 Melanocytic nevi of trunk: Secondary | ICD-10-CM | POA: Diagnosis not present

## 2020-10-14 DIAGNOSIS — C44319 Basal cell carcinoma of skin of other parts of face: Secondary | ICD-10-CM | POA: Diagnosis not present

## 2020-10-14 DIAGNOSIS — L570 Actinic keratosis: Secondary | ICD-10-CM | POA: Diagnosis not present

## 2020-10-14 DIAGNOSIS — X32XXXD Exposure to sunlight, subsequent encounter: Secondary | ICD-10-CM | POA: Diagnosis not present

## 2020-10-14 DIAGNOSIS — B070 Plantar wart: Secondary | ICD-10-CM | POA: Diagnosis not present

## 2020-10-14 DIAGNOSIS — Z1283 Encounter for screening for malignant neoplasm of skin: Secondary | ICD-10-CM | POA: Diagnosis not present

## 2020-12-08 DIAGNOSIS — Z08 Encounter for follow-up examination after completed treatment for malignant neoplasm: Secondary | ICD-10-CM | POA: Diagnosis not present

## 2020-12-08 DIAGNOSIS — Z85828 Personal history of other malignant neoplasm of skin: Secondary | ICD-10-CM | POA: Diagnosis not present

## 2020-12-09 DIAGNOSIS — Z0000 Encounter for general adult medical examination without abnormal findings: Secondary | ICD-10-CM | POA: Diagnosis not present

## 2020-12-09 DIAGNOSIS — R7301 Impaired fasting glucose: Secondary | ICD-10-CM | POA: Diagnosis not present

## 2020-12-09 DIAGNOSIS — R202 Paresthesia of skin: Secondary | ICD-10-CM | POA: Diagnosis not present

## 2020-12-09 DIAGNOSIS — E7800 Pure hypercholesterolemia, unspecified: Secondary | ICD-10-CM | POA: Diagnosis not present

## 2020-12-09 DIAGNOSIS — I10 Essential (primary) hypertension: Secondary | ICD-10-CM | POA: Diagnosis not present

## 2020-12-18 DIAGNOSIS — Z23 Encounter for immunization: Secondary | ICD-10-CM | POA: Diagnosis not present

## 2021-04-01 DIAGNOSIS — C61 Malignant neoplasm of prostate: Secondary | ICD-10-CM | POA: Diagnosis not present

## 2021-04-08 DIAGNOSIS — N401 Enlarged prostate with lower urinary tract symptoms: Secondary | ICD-10-CM | POA: Diagnosis not present

## 2021-04-08 DIAGNOSIS — C61 Malignant neoplasm of prostate: Secondary | ICD-10-CM | POA: Diagnosis not present

## 2021-04-08 DIAGNOSIS — R3912 Poor urinary stream: Secondary | ICD-10-CM | POA: Diagnosis not present

## 2021-04-12 DIAGNOSIS — H2513 Age-related nuclear cataract, bilateral: Secondary | ICD-10-CM | POA: Diagnosis not present

## 2021-04-12 DIAGNOSIS — E119 Type 2 diabetes mellitus without complications: Secondary | ICD-10-CM | POA: Diagnosis not present

## 2021-04-12 DIAGNOSIS — H5203 Hypermetropia, bilateral: Secondary | ICD-10-CM | POA: Diagnosis not present

## 2021-05-03 ENCOUNTER — Other Ambulatory Visit: Payer: Self-pay | Admitting: Urology

## 2021-05-03 DIAGNOSIS — C61 Malignant neoplasm of prostate: Secondary | ICD-10-CM

## 2021-05-14 ENCOUNTER — Ambulatory Visit
Admission: RE | Admit: 2021-05-14 | Discharge: 2021-05-14 | Disposition: A | Discharge status: Home / Self Care | Payer: PPO | Source: Ambulatory Visit | Attending: Urology | Admitting: Urology

## 2021-05-14 DIAGNOSIS — C61 Malignant neoplasm of prostate: Secondary | ICD-10-CM

## 2021-05-14 DIAGNOSIS — N4289 Other specified disorders of prostate: Secondary | ICD-10-CM | POA: Diagnosis not present

## 2021-05-14 DIAGNOSIS — Z8546 Personal history of malignant neoplasm of prostate: Secondary | ICD-10-CM | POA: Diagnosis not present

## 2021-05-14 MED ORDER — GADOBENATE DIMEGLUMINE 529 MG/ML IV SOLN
15.0000 mL | Freq: Once | INTRAVENOUS | Status: AC | PRN
  Administered 2021-05-14: 15 mL via INTRAVENOUS

## 2021-06-25 DIAGNOSIS — D075 Carcinoma in situ of prostate: Secondary | ICD-10-CM | POA: Diagnosis not present

## 2021-06-25 DIAGNOSIS — C61 Malignant neoplasm of prostate: Secondary | ICD-10-CM | POA: Insufficient documentation

## 2021-07-06 DIAGNOSIS — C61 Malignant neoplasm of prostate: Secondary | ICD-10-CM | POA: Diagnosis not present

## 2021-07-09 NOTE — Progress Notes (Signed)
GU Location of Tumor / Histology: Prostate Ca ? ?If Prostate Cancer, Gleason Score is (4 + 3) and PSA is (6.69 as of 06/2021) ? ?Biopsies: ?Dr. Junious Silk ? ? ? ? ? ?Past/Anticipated interventions by urology, if any:  ? ?Past/Anticipated interventions by medical oncology, if any: NA ? ?Weight changes, if any:  No ? ?IPSS:  12 ?SHIM:  14 ? ?Bowel/Bladder complaints, if any:  No ? ?Nausea/Vomiting, if any: No ? ?Pain issues, if any:  0/10 ? ?SAFETY ISSUES: ?Prior radiation? No ?Pacemaker/ICD? No ?Possible current pregnancy? Male ?Is the patient on methotrexate? No ? ?Current Complaints / other details:  Need more information on treatment. ?

## 2021-07-13 DIAGNOSIS — R011 Cardiac murmur, unspecified: Secondary | ICD-10-CM | POA: Insufficient documentation

## 2021-07-13 DIAGNOSIS — I10 Essential (primary) hypertension: Secondary | ICD-10-CM | POA: Insufficient documentation

## 2021-07-13 DIAGNOSIS — E7800 Pure hypercholesterolemia, unspecified: Secondary | ICD-10-CM | POA: Insufficient documentation

## 2021-07-15 ENCOUNTER — Encounter: Payer: Self-pay | Admitting: Radiation Oncology

## 2021-07-15 ENCOUNTER — Other Ambulatory Visit: Payer: Self-pay

## 2021-07-15 ENCOUNTER — Ambulatory Visit
Admission: RE | Admit: 2021-07-15 | Discharge: 2021-07-15 | Disposition: A | Discharge status: Home / Self Care | Payer: PPO | Source: Ambulatory Visit | Attending: Radiation Oncology | Admitting: Radiation Oncology

## 2021-07-15 DIAGNOSIS — R7301 Impaired fasting glucose: Secondary | ICD-10-CM | POA: Insufficient documentation

## 2021-07-15 DIAGNOSIS — I10 Essential (primary) hypertension: Secondary | ICD-10-CM

## 2021-07-15 DIAGNOSIS — C61 Malignant neoplasm of prostate: Secondary | ICD-10-CM | POA: Insufficient documentation

## 2021-07-15 DIAGNOSIS — Z79899 Other long term (current) drug therapy: Secondary | ICD-10-CM | POA: Insufficient documentation

## 2021-07-15 DIAGNOSIS — N138 Other obstructive and reflux uropathy: Secondary | ICD-10-CM

## 2021-07-15 DIAGNOSIS — R011 Cardiac murmur, unspecified: Secondary | ICD-10-CM | POA: Insufficient documentation

## 2021-07-15 DIAGNOSIS — E7800 Pure hypercholesterolemia, unspecified: Secondary | ICD-10-CM | POA: Diagnosis not present

## 2021-07-15 DIAGNOSIS — N401 Enlarged prostate with lower urinary tract symptoms: Secondary | ICD-10-CM | POA: Diagnosis not present

## 2021-07-15 DIAGNOSIS — Z860100 Personal history of colon polyps, unspecified: Secondary | ICD-10-CM | POA: Insufficient documentation

## 2021-07-15 DIAGNOSIS — Z191 Hormone sensitive malignancy status: Secondary | ICD-10-CM | POA: Diagnosis not present

## 2021-07-15 DIAGNOSIS — Z8546 Personal history of malignant neoplasm of prostate: Secondary | ICD-10-CM | POA: Insufficient documentation

## 2021-07-15 DIAGNOSIS — L570 Actinic keratosis: Secondary | ICD-10-CM | POA: Insufficient documentation

## 2021-07-15 NOTE — Progress Notes (Signed)
?Radiation Oncology         [phone] ?________________________________ ? ?Initial Outpatient Consultation ? ?Name: James Pace MRN: [id number]  ?Date: 07/15/2021  DOB: 09/22/1945 ? ?JG:GEZMOQ, Lennette Bihari, MD  [name], MD  ? ?REFERRING PHYSICIAN: [name], MD ? ?DIAGNOSIS: 76 y.o. gentleman with Stage T1c adenocarcinoma of the prostate with Gleason score of 3+4, and PSA of 6.69. ? ?  ICD-10-CM   ?1. Essential hypertension  I10   ?  ?2. Pure hypercholesterolemia  E78.00   ?  ?3. cardiac murmurs  R01.1   ?  ?4. 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 N13.8   ?  ?5. Prostate cancer (James Pace)  C61   ?  ? ? ?HISTORY OF PRESENT ILLNESS: James Pace is a 76 y.o. male with a diagnosis of prostate cancer. He is an established urology patient, followed by Dr. Junious Silk since at least 2015 when he was initially diagnosed with Gleason 3+3 adenocarcinoma of the prostate in 2 out of 12 cores with a PSA of 5.01 at that time.  He elected to proceed in active surveillance and has been followed closely since that time, with digital rectal examinations remaining normal, without nodules or concerning findings and PSA fluctuating.  He had a surveillance prostate MRI in August 2016 which was without any evidence of high-grade lesions.  A surveillance MRI fusion biopsy was performed in 01/2016 showing Gleason 3+3 in 5 out of 20 cores.he elected to continue in active surveillance and his PSA continued to fluctuate between 3.75 - 8.  His most recent PSA on 04/01/2021 was 6.69.  A repeat surveillance prostate MRI was performed on 05/14/2021 showing 2 PI-RADS 4 lesions in the left peripheral zone and a small PI-RADS 3 lesion in the right apical peripheral zone with possible ECE at ROI #1.  The patient proceeded to repeat MRI fusion transrectal ultrasound with 20 biopsies of the prostate on 06/25/2021.  The prostate volume measured 83.6 cc.  Out of 20 core biopsies, 8 were positive.  The maximum Gleason score was 3+4, and this  was seen in 2/2 cores from ROI #2 and 1/1 core from ROI #3 as well as in the core from the left base lateral on systematic biopsy.  Additionally, Gleason 3+3 was seen in the left base, left mid, left apex lateral and right base lateral.  All 3/3 samples from ROI #1 were benign. ? ?The patient reviewed the biopsy results with his urologist and he has kindly been referred today for discussion of potential radiation treatment options. ? ? ?PREVIOUS RADIATION THERAPY: No ? ?PAST MEDICAL HISTORY: History reviewed. No pertinent past medical history.   ? ?PAST SURGICAL HISTORY:History reviewed. No pertinent surgical history. ? ?FAMILY HISTORY: History reviewed. No pertinent family history. ? ?SOCIAL HISTORY:  ?Social History  ? ?Socioeconomic History  ? Marital status: Married  ?  Spouse name: Not on file  ? Number of children: Not on file  ? Years of education: Not on file  ? Highest education level: Not on file  ?Occupational History  ? Not on file  ?Tobacco Use  ? Smoking status: Not on file  ? Smokeless tobacco: Not on file  ?Substance and Sexual Activity  ? Alcohol use: Not on file  ? Drug use: Not on file  ? Sexual activity: Not on file  ?Other Topics Concern  ? Not on file  ?Social History Narrative  ? Not on file  ? ?Social Determinants of Health  ? ?Financial  Resource Strain: Not on file  ?Food Insecurity: Not on file  ?Transportation Needs: Not on file  ?Physical Activity: Not on file  ?Stress: Not on file  ?Social Connections: Not on file  ?Intimate Partner Violence: Not on file  ? ? ?ALLERGIES: Amoxicillin-pot clavulanate and Latex ? ?MEDICATIONS:  ?Current Outpatient Medications  ?Medication Sig Dispense Refill  ? BINAXNOW COVID-19 AG HOME TEST KIT See admin instructions.    ? lisinopril (ZESTRIL) 20 MG tablet Take 20 mg by mouth daily.    ? Omega 3 1200 MG CAPS     ? S-Adenosylmethionine (SAM-E) 400 MG TBEC 1 tablet    ? simvastatin (ZOCOR) 40 MG tablet Take 1 tablet by mouth daily.    ? ?No current  facility-administered medications for this encounter.  ? ? ?REVIEW OF SYSTEMS:  On review of systems, the patient reports that he is doing well overall. He denies any chest pain, shortness of breath, cough, fevers, chills, night sweats, unintended weight changes. He denies any bowel disturbances, and denies abdominal pain, nausea or vomiting. He denies any new musculoskeletal or joint aches or pains. His IPSS was 12, indicating moderate urinary symptoms with a weak flow of stream, frequency and nocturia x1. His SHIM was 14, indicating he has moderate erectile dysfunction. A complete review of systems is obtained and is otherwise negative. ? ?  ?PHYSICAL EXAM:  ?Wt Readings from Last 3 Encounters:  ?07/15/21 174 lb 12.8 oz (79.3 kg)  ? ?Temp Readings from Last 3 Encounters:  ?07/15/21 (!) 97.5 ?F (36.4 ?C)  ? ?BP Readings from Last 3 Encounters:  ?07/15/21 (!) 150/79  ? ?Pulse Readings from Last 3 Encounters:  ?07/15/21 (!) 51  ? ?Pain Assessment ?Pain Score: 0-No pain/10 ? ?In general this is a well appearing Caucasian male in no acute distress. He's alert and oriented x4 and appropriate throughout the examination. Cardiopulmonary assessment is negative for acute distress, and he exhibits normal effort.   ? ? ?KPS = 100 ? ?100 - Normal; no complaints; no evidence of disease. ?90   - Able to carry on normal activity; minor signs or symptoms of disease. ?80   - Normal activity with effort; some signs or symptoms of disease. ?57   - Cares for self; unable to carry on normal activity or to do active work. ?60   - Requires occasional assistance, but is able to care for most of his personal needs. ?50   - Requires considerable assistance and frequent medical care. ?55   - Disabled; requires special care and assistance. ?30   - Severely disabled; hospital admission is indicated although death not imminent. ?20   - Very sick; hospital admission necessary; active supportive treatment necessary. ?10   - Moribund; fatal  processes progressing rapidly. ?0     - Dead ? ?Karnofsky DA, Abelmann WH, Craver LS and Burchenal [facility] [phone]) The use of the nitrogen mustards in the palliative treatment of carcinoma: with particular reference to bronchogenic carcinoma Cancer 1 [phone] ? ?LABORATORY DATA:  ?Lab Results  ?Component Value Date  ? HGB 17.0 03/18/2013  ? HCT 50.0 03/18/2013  ? ?Lab Results  ?Component Value Date  ? NA 143 03/18/2013  ? K 3.8 03/18/2013  ? CL 102 03/18/2013  ? ?No results found for: ALT, AST, GGT, ALKPHOS, BILITOT ?  ?RADIOGRAPHY: No results found. ?   ?IMPRESSION/PLAN: ?1. 76 y.o. gentleman with Stage T1c adenocarcinoma of the prostate with Gleason Score of 3+4, and PSA of 6.69. ?We discussed  the patient's workup and outlined the nature of prostate cancer in this setting. The patient's T stage, Gleason's score, and PSA put him into the favorable intermediate risk group. Accordingly, he is eligible for a variety of potential treatment options including brachytherapy, 5.5 weeks of external radiation or prostatectomy. We discussed the available radiation techniques, and focused on the details and logistics of delivery. The patient is not an ideal candidate for brachytherapy with a prostate volume of 83.6 cc. We discussed and outlined the risks, benefits, short and long-term effects associated with radiotherapy and compared and contrasted these with prostatectomy. We discussed the role of SpaceOAR gel in reducing the rectal toxicity associated with radiotherapy.   He appears to have a good understanding of his disease and our treatment recommendations which are of curative intent.  He was encouraged to ask questions that were answered to his stated satisfaction. ? ?At the conclusion of our conversation, the patient is interested in moving forward with 5.5 weeks of external beam therapy but prefers to postpone the start of treatment until June 2023 due to several family commitments coming up throughout the month of May. We  will share our discussion with Dr. Junious Silk and make arrangements for fiducial markers and SpaceOAR gel placement in early June 2023, prior to simulation, to reduce rectal toxicity from radiotherapy. The patient appea

## 2021-07-15 NOTE — Progress Notes (Signed)
Called patient to introduced myself to the patient as the prostate nurse navigator.  No barriers to care identified at this time. Voicemail left for patient to call for any additional questions or concerns.  ?

## 2021-07-26 ENCOUNTER — Other Ambulatory Visit: Payer: Self-pay | Admitting: Urology

## 2021-07-27 ENCOUNTER — Other Ambulatory Visit: Payer: Self-pay | Admitting: Urology

## 2021-07-28 ENCOUNTER — Telehealth: Payer: Self-pay | Admitting: *Deleted

## 2021-07-28 ENCOUNTER — Other Ambulatory Visit: Payer: Self-pay | Admitting: Urology

## 2021-07-28 NOTE — Telephone Encounter (Signed)
CALLED PATIENT TO INFORM OF FID, MARKERS AND SPACE OAR PLACEMENT ON 09-07-21 AND HIS SIM ON 09-16-21- ARRIVAL TIME- 12:45 PM @ [place], SPOKE WITH PATIENT AND HE IS AWARE OF THESE APPTS. ?

## 2021-08-10 DIAGNOSIS — D2272 Melanocytic nevi of left lower limb, including hip: Secondary | ICD-10-CM | POA: Diagnosis not present

## 2021-08-10 DIAGNOSIS — Z1283 Encounter for screening for malignant neoplasm of skin: Secondary | ICD-10-CM | POA: Diagnosis not present

## 2021-08-10 DIAGNOSIS — D225 Melanocytic nevi of trunk: Secondary | ICD-10-CM | POA: Diagnosis not present

## 2021-08-10 DIAGNOSIS — Z85828 Personal history of other malignant neoplasm of skin: Secondary | ICD-10-CM | POA: Diagnosis not present

## 2021-08-10 DIAGNOSIS — D485 Neoplasm of uncertain behavior of skin: Secondary | ICD-10-CM | POA: Diagnosis not present

## 2021-08-10 DIAGNOSIS — Z08 Encounter for follow-up examination after completed treatment for malignant neoplasm: Secondary | ICD-10-CM | POA: Diagnosis not present

## 2021-08-31 DIAGNOSIS — C61 Malignant neoplasm of prostate: Secondary | ICD-10-CM | POA: Diagnosis not present

## 2021-08-31 DIAGNOSIS — N390 Urinary tract infection, site not specified: Secondary | ICD-10-CM | POA: Diagnosis not present

## 2021-09-01 ENCOUNTER — Encounter (HOSPITAL_BASED_OUTPATIENT_CLINIC_OR_DEPARTMENT_OTHER): Payer: Self-pay | Admitting: Urology

## 2021-09-01 ENCOUNTER — Other Ambulatory Visit: Payer: Self-pay

## 2021-09-01 NOTE — Progress Notes (Signed)
Spoke w/ via phone for pre-op interview---pt Lab needs dos----  I stat and EKG           Lab results------n/a COVID test -----patient states asymptomatic no test needed Arrive at ------- NPO after MN NO Solid Food.  Clear liquids from MN until--- Med rec completed Medications to take morning of surgery -----none Diabetic medication -----n/a Patient instructed no nail polish to be worn day of surgery Patient instructed to bring photo id and insurance card day of surgery Patient aware to have Driver (ride ) / caregiver wife Pamala Hurry   for 24 hours after surgery  Patient Special Instructions -----n/a Pre-Op special Istructions -----n/a Patient verbalized understanding of instructions that were given at this phone interview. Patient denies shortness of breath, chest pain, fever, cough at this phone interview.

## 2021-09-06 NOTE — H&P (Signed)
Office Visit Report     08/31/2021   --------------------------------------------------------------------------------   James Pace  MRN: [id number]  DOB: 07/27/1945, 76 year old Male  SSN: [id number]   PRIMARY CARE:  Juanell Fairly, MD  REFERRING:  Georgette Dover, MD  PROVIDER:  [name], M.D.  TREATING:  Jiles Crocker, NP  LOCATION:  [facility] [phone]     --------------------------------------------------------------------------------   CC/HPI: F/u -   1) PCa - on AS since 2015 with surveillance bx in 2017.   Biopsy:  #04 Apr 2013 Low risk Prostate Cancer  PSA 5.01  T1c  Prostate 67 grams  Gleason 3+3=6 left base, two cores, 20%  Partin T1c and T2a show 0-1 % risk SV or LN.  Staging: December 2014 prostate MRI suspicious area right transition zone, left base. Otherwise negative.  -June 2015 PSA 3.65  -Jul 2016 PSA 6.81   #04 Jan 2016 Low risk PCa - surveillance BX:  T1c  PSA 4.53  Prostate 83 grams  Gleason 3+3=6, 10%, one core, left base  Gleason 3+3=6, 30%, one core, right apex (ROI fusion bx), +PNI  3/15   #04 June 2021 favorable intermediate risk prostate cancer  PSA 6.69  T1c  Prostate 84 (TRUS) -111 (MRI) g (PSA density 0.07)  GG2, 4 cores, 5 to 50%-the left peripheral zone and the right MRI lesions were positive. The right apical/capsular lesion on MRI benign.  GG1, 4 cores, 5 to 10%  8/20    Staging:  Aug 2016 - Prostate MRI - small area right apex, left mid possible "low grade" PCa otw negative. Negative - ECE, SV, NVB, LAD, Bones.  2018 - prostate MRI-83 g prostate, PI-RADS 3 lesion left 13 mm; PI-RADS 2 lesion right apex 5 mm  February 2023 prostate MRI-prostate 111 g, 3 ROI: A PI-RADS 4 lesion 2 cm left apex to base peripheral zone which abuts capsule, PI-RADS 4 lesion left peripheral zone base 1.4 cm, a right PI-RADS 3 near the apex peripheral zone 10 mm. All 3 of these areas have slightly increased in size.  Staging negative, but as noted the PI-RADS 4 lesion left apex to base abuts the capsule.    He has h/o of urgency, frequency at times. Also ED.   PSA was 04/18 3.81, 10/18 8.38. PSA quickly rechecked and noted to be 05/19 3.75 and 4.19. PSA 3.79 and 5.49. Prostate 83 g on imaging. PSA 04/21 4.21 and 10/21 5.26.   PSA stable 04/22 at 5.09 with psad 0.06 and 12/22 PSA 6.69. Feb 2023 prostate MRI with increased size and PIRADs scale.    2) BPH - since 2013 - Prostate 67 grams on bx in 2015 and 83 grams on bx 2017.   AUASS = 8-14, mostly satisfied. He has a slow flow. On surveillance. NO changes in voiding. PVR 72 ml.    08/31/2021: Here today for preoperative appointment prior to undergoing fiducial marker placement and SpaceOAR insertion on 6/6 in anticipation of beginning IMRT a few weeks after for definitive treatment of his underlying prostate cancer diagnosis. Patient denies any changes in past medical history, denies any new prescription medications taken on a daily basis, no interval surgical or procedural intervention. Denies any new or worsening lower urinary tract symptoms including absence of dysuria, gross hematuria, no interval treatment for UTI. He denies any chest pain, shortness of breath, lightheadedness/dizziness, numbness or tingling in the extremities.     ALLERGIES: latex - **suspected**    MEDICATIONS:  Lisinopril 20 mg tablet Oral  Omega-3 350 mg-235 mg-90 mg-597 mg capsule,delayed release Oral  Sam-E  Simvastatin 40 mg tablet Oral     GU PSH: Prostate Needle Biopsy - 06/25/2021, 2017 Rpr Umbil Hern; Reduc < 5 Yr - 2009     NON-GU PSH: Surgical Pathology, Gross And Microscopic Examination For Prostate Needle - 06/25/2021, 2017     GU PMH: Prostate Cancer, I had a long discussion with the patient and his wife using his path report as a reference. We discussed his stage, grade and prognosis. We discussed the nature risks and benefits of active surveillance, radical  prostatectomy, external beam radiotherapy, and brachytherapy. We discussed the role of androgen deprivation and chemotherapy in prostate cancer. We also discussed other ablative techniques such as HiFU and cryotherapy as well as whole gland versus focal treatment. We discussed specifically how each treatment might affect bowel, bladder and sexual function. We discussed how each treatment might effect salvage treatments and active surveillance might lead to progression and more difficult treatment in the future. All questions answered. His wife is a cancer survivor. She had uterine cancer which spread into the LNs eight years ago. He is interested in radiation for his prostate cancer. Will refer to Dr. Tammi Klippel. Also disc spaceoar and gold seed nature r/b/a - a colleague may be doing that portion. - 07/06/2021, Prostate was 84 g. May be some subtle right nodularity. Ultrasound was benign. I will notify him of results., - 06/25/2021, PSA up slightly. Check MRI and consider bx. , - 04/08/2021, Disc nature r/b of cont AS, bx or MRI. , - 08/06/2020, CHeck PSA and DRE in 6 mo. DIsc repeat bx or MRI. Pt wants to cont surveillance which is reasonable. , - 02/04/2020, PSAD nl and PSA low. Looks good. , - 2021, - 2020, - 2020, - 2019, - 2019, - 2018, - 2018, - 2017, - 2017, Prostate cancer, - 2017 BPH w/LUTS - 04/08/2021, Doing well. , - 08/06/2020, - 02/04/2020, on surveillance , - 2021, - 2020, - 2020, - 2019, - 2019, - 2018, - 2018, Benign prostatic hyperplasia with urinary obstruction, - 2017 Weak Urinary Stream - 04/08/2021, - 08/06/2020, - 02/04/2020 (Stable), - 2019, - 2018 Urinary Hesitancy - 2018 Elevated PSA, Elevated prostate specific antigen (PSA) - 2015 Oth GU systems Signs/Symptoms, Hard, firm prostate - 2015    NON-GU PMH: Encounter for general adult medical examination without abnormal findings, Encounter for preventive health examination - 2017 Cardiac murmur, unspecified, Murmurs - 2014 Personal history of other  diseases of the circulatory system, History of hypertension - 2014 Personal history of other endocrine, nutritional and metabolic disease, History of hypercholesterolemia - 2014    FAMILY HISTORY: Cardiac Arrest - Father Family Health Status Number - Runs In Family Lung Cancer - Father   SOCIAL HISTORY: Marital Status: Married Preferred Language: English; Ethnicity: Not Hispanic Or Latino; Race: White Current Smoking Status: Patient has never smoked.  Has never drank.  Drinks 2 caffeinated drinks per day. Patient's occupation is/was semi retired.    REVIEW OF SYSTEMS:    GU Review Male:   Patient reports frequent urination. Patient denies hard to postpone urination, burning/ pain with urination, get up at night to urinate, leakage of urine, stream starts and stops, trouble starting your stream, have to strain to urinate , erection problems, and penile pain.  Gastrointestinal (Upper):   Patient denies nausea, vomiting, and indigestion/ heartburn.  Gastrointestinal (Lower):   Patient denies diarrhea and constipation.  Constitutional:  Patient denies fever, night sweats, weight loss, and fatigue.  Skin:   Patient denies skin rash/ lesion and itching.  Eyes:   Patient denies blurred vision and double vision.  Ears/ Nose/ Throat:   Patient denies sore throat and sinus problems.  Hematologic/Lymphatic:   Patient denies swollen glands and easy bruising.  Cardiovascular:   Patient denies leg swelling and chest pains.  Respiratory:   Patient denies cough and shortness of breath.  Endocrine:   Patient denies excessive thirst.  Musculoskeletal:   Patient denies back pain and joint pain.  Neurological:   Patient denies headaches and dizziness.  Psychologic:   Patient denies anxiety and depression.   Notes: Updated from previous visit 02/04/2020 with review from patient as noted above.   VITAL SIGNS:      08/31/2021 01:14 PM  Height 66 in / 167.64 cm  BP 170/85 mmHg  Heart Rate 51 /min   Temperature 97.5 F / 36.3 C   MULTI-SYSTEM PHYSICAL EXAMINATION:    Constitutional: Well-nourished. No physical deformities. Normally developed. Good grooming.  Neck: Neck symmetrical, not swollen. Normal tracheal position.  Respiratory: No labored breathing, no use of accessory muscles.   Cardiovascular: Normal temperature, normal extremity pulses, no swelling, no varicosities.  Skin: No paleness, no jaundice, no cyanosis. No lesion, no ulcer, no rash.  Neurologic / Psychiatric: Oriented to time, oriented to place, oriented to person. No depression, no anxiety, no agitation.  Gastrointestinal: No mass, no tenderness, no rigidity, non obese abdomen.  Musculoskeletal: Normal gait and station of head and neck.     Complexity of Data:  Source Of History:  Patient, Family/Caregiver, Medical Record Summary  Lab Test Review:   PSA  Records Review:   Pathology Reports, Previous Doctor Records, Previous Hospital Records, Previous Patient Records  Urine Test Review:   Urinalysis   04/01/21 07/30/20 01/28/20 07/31/19 01/30/19 07/31/18 02/05/18 08/02/17  PSA  Total PSA 6.69 ng/mL 5.09 ng/mL 5.46 ng/mL 4.21 ng/mL 5.49 ng/mL 3.79 ng/mL 3.49 ng/mL 4.19 ng/mL  Free PSA   1.58 ng/mL 0.99 ng/mL      % Free PSA   29 % PSA 24 % PSA        PROCEDURES:          Urinalysis Dipstick Dipstick Cont'd  Color: Yellow Bilirubin: Neg mg/dL  Appearance: Clear Ketones: Neg mg/dL  Specific Gravity: 1.010 Blood: Neg ery/uL  pH: 6.0 Protein: Neg mg/dL  Glucose: Neg mg/dL Urobilinogen: 0.2 mg/dL    Nitrites: Neg    Leukocyte Esterase: Neg leu/uL    ASSESSMENT:      ICD-10 Details  1 GU:   Prostate Cancer - C61 Chronic, Threat to Bodily Function  2 NON-GU:   Encounter for other preprocedural examination - Z01.818 Undiagnosed New Problem   PLAN:           Orders Labs Urine Culture          Schedule Return Visit/Planned Activity: Keep Scheduled Appointment - Follow up MD, Schedule Surgery           Document Letter(s):  Created for Patient: Clinical Summary         Notes:   All questions answered to the best my ability regarding the upcoming procedure and expected postoperative course with understanding expressed by the patient. Urine culture sent today to serve as a baseline. He will proceed with previously scheduled fiducial marker placement and SpaceOAR insertion on 6/6.        Next Appointment:  Next Appointment: 09/07/2021 09:30 AM    Appointment Type: Surgery     Location: [facility] [phone]    Provider: [name], M.D.    Reason for Visit: NE/OP FID MARKER AND SPACE OAR      * Signed by Jiles Crocker, NP on 08/31/21 at 1:40 PM (EDT)*      The information contained in this medical record document is considered private and confidential patient information. This information can only be used for the medical diagnosis and/or medical services that are being provided by the patient's selected caregivers. This information can only be distributed outside of the patient's care if the patient agrees and signs waivers of authorization for this information to be sent to an outside source or route.   Add: Urine cx negative

## 2021-09-06 NOTE — Progress Notes (Signed)
Clarified arrival time of 0730 which is same as patient's plan and understanding.

## 2021-09-07 ENCOUNTER — Ambulatory Visit (HOSPITAL_BASED_OUTPATIENT_CLINIC_OR_DEPARTMENT_OTHER)
Admission: RE | Admit: 2021-09-07 | Discharge: 2021-09-07 | Disposition: A | Discharge status: Home / Self Care | Payer: PPO | Attending: Urology | Admitting: Urology

## 2021-09-07 ENCOUNTER — Ambulatory Visit (HOSPITAL_BASED_OUTPATIENT_CLINIC_OR_DEPARTMENT_OTHER): Payer: PPO | Admitting: Anesthesiology

## 2021-09-07 ENCOUNTER — Other Ambulatory Visit: Payer: Self-pay

## 2021-09-07 ENCOUNTER — Encounter (HOSPITAL_BASED_OUTPATIENT_CLINIC_OR_DEPARTMENT_OTHER)
Admission: RE | Disposition: A | Discharge status: Home / Self Care | Payer: Self-pay | Source: Home / Self Care | Attending: Urology

## 2021-09-07 ENCOUNTER — Encounter (HOSPITAL_BASED_OUTPATIENT_CLINIC_OR_DEPARTMENT_OTHER): Payer: Self-pay | Admitting: Urology

## 2021-09-07 DIAGNOSIS — Z79899 Other long term (current) drug therapy: Secondary | ICD-10-CM | POA: Insufficient documentation

## 2021-09-07 DIAGNOSIS — I10 Essential (primary) hypertension: Secondary | ICD-10-CM

## 2021-09-07 DIAGNOSIS — C61 Malignant neoplasm of prostate: Secondary | ICD-10-CM | POA: Diagnosis not present

## 2021-09-07 DIAGNOSIS — N401 Enlarged prostate with lower urinary tract symptoms: Secondary | ICD-10-CM | POA: Diagnosis not present

## 2021-09-07 DIAGNOSIS — R3912 Poor urinary stream: Secondary | ICD-10-CM | POA: Insufficient documentation

## 2021-09-07 DIAGNOSIS — Z8546 Personal history of malignant neoplasm of prostate: Secondary | ICD-10-CM

## 2021-09-07 HISTORY — PX: SPACE OAR INSTILLATION: SHX6769

## 2021-09-07 HISTORY — PX: GOLD SEED IMPLANT: SHX6343

## 2021-09-07 HISTORY — DX: Malignant (primary) neoplasm, unspecified: C80.1

## 2021-09-07 HISTORY — DX: Prediabetes: R73.03

## 2021-09-07 HISTORY — DX: Essential (primary) hypertension: I10

## 2021-09-07 HISTORY — DX: Cardiac murmur, unspecified: R01.1

## 2021-09-07 LAB — POCT I-STAT, CHEM 8
BUN: 13 mg/dL (ref 8–23)
Calcium, Ion: 1.33 mmol/L (ref 1.15–1.40)
Chloride: 101 mmol/L (ref 98–111)
Creatinine, Ser: 0.9 mg/dL (ref 0.61–1.24)
Glucose, Bld: 111 mg/dL — ABNORMAL HIGH (ref 70–99)
HCT: 46 % (ref 39.0–52.0)
Hemoglobin: 15.6 g/dL (ref 13.0–17.0)
Potassium: 4 mmol/L (ref 3.5–5.1)
Sodium: 141 mmol/L (ref 135–145)
TCO2: 28 mmol/L (ref 22–32)

## 2021-09-07 SURGERY — INSERTION, GOLD SEEDS
Anesthesia: Monitor Anesthesia Care | Site: Prostate

## 2021-09-07 MED ORDER — OXYCODONE HCL 5 MG/5ML PO SOLN: 5.0000 mg | Freq: Once | ORAL | Status: DC | PRN

## 2021-09-07 MED ORDER — CIPROFLOXACIN IN D5W 400 MG/200ML IV SOLN
400.0000 mg | Freq: Once | INTRAVENOUS | Status: AC
  Administered 2021-09-07: 400 mg via INTRAVENOUS

## 2021-09-07 MED ORDER — ACETAMINOPHEN 500 MG PO TABS
1000.0000 mg | ORAL_TABLET | Freq: Once | ORAL | Status: AC
Start: 2021-09-07 — End: 2021-09-07
  Administered 2021-09-07: 1000 mg via ORAL

## 2021-09-07 MED ORDER — LIDOCAINE HCL 1 % IJ SOLN
INTRAMUSCULAR | Status: DC | PRN
  Administered 2021-09-07: 6 mL

## 2021-09-07 MED ORDER — ONDANSETRON HCL 4 MG/2ML IJ SOLN
INTRAMUSCULAR | Status: AC
  Filled 2021-09-07: qty 2

## 2021-09-07 MED ORDER — OXYCODONE HCL 5 MG PO TABS: 5.0000 mg | ORAL_TABLET | Freq: Once | ORAL | Status: DC | PRN

## 2021-09-07 MED ORDER — PROPOFOL 10 MG/ML IV BOLUS
INTRAVENOUS | Status: DC | PRN
  Administered 2021-09-07: 20 mg via INTRAVENOUS

## 2021-09-07 MED ORDER — PROPOFOL 1000 MG/100ML IV EMUL
INTRAVENOUS | Status: AC
  Filled 2021-09-07: qty 100

## 2021-09-07 MED ORDER — LIDOCAINE HCL (PF) 2 % IJ SOLN
INTRAMUSCULAR | Status: AC
  Filled 2021-09-07: qty 5

## 2021-09-07 MED ORDER — ACETAMINOPHEN 500 MG PO TABS
ORAL_TABLET | ORAL | Status: AC
  Filled 2021-09-07: qty 2

## 2021-09-07 MED ORDER — DEXMEDETOMIDINE (PRECEDEX) IN NS 20 MCG/5ML (4 MCG/ML) IV SYRINGE
PREFILLED_SYRINGE | INTRAVENOUS | Status: AC
  Filled 2021-09-07: qty 5

## 2021-09-07 MED ORDER — FENTANYL CITRATE (PF) 100 MCG/2ML IJ SOLN: 25.0000 ug | INTRAMUSCULAR | Status: DC | PRN

## 2021-09-07 MED ORDER — PROPOFOL 500 MG/50ML IV EMUL
INTRAVENOUS | Status: DC | PRN
  Administered 2021-09-07: 50 ug/kg/min via INTRAVENOUS

## 2021-09-07 MED ORDER — ONDANSETRON HCL 4 MG/2ML IJ SOLN
INTRAMUSCULAR | Status: DC | PRN
  Administered 2021-09-07: 4 mg via INTRAVENOUS

## 2021-09-07 MED ORDER — CIPROFLOXACIN IN D5W 400 MG/200ML IV SOLN
INTRAVENOUS | Status: AC
  Filled 2021-09-07: qty 200

## 2021-09-07 MED ORDER — EPHEDRINE SULFATE (PRESSORS) 50 MG/ML IJ SOLN
INTRAMUSCULAR | Status: DC | PRN
  Administered 2021-09-07 (×2): 10 mg via INTRAVENOUS

## 2021-09-07 MED ORDER — DEXMEDETOMIDINE (PRECEDEX) IN NS 20 MCG/5ML (4 MCG/ML) IV SYRINGE
PREFILLED_SYRINGE | INTRAVENOUS | Status: DC | PRN
  Administered 2021-09-07 (×3): 4 ug via INTRAVENOUS

## 2021-09-07 MED ORDER — LACTATED RINGERS IV SOLN: INTRAVENOUS | Status: DC

## 2021-09-07 MED ORDER — EPHEDRINE 5 MG/ML INJ
INTRAVENOUS | Status: AC
  Filled 2021-09-07: qty 5

## 2021-09-07 MED ORDER — LIDOCAINE HCL (CARDIAC) PF 100 MG/5ML IV SOSY
PREFILLED_SYRINGE | INTRAVENOUS | Status: DC | PRN
  Administered 2021-09-07: 40 mg via INTRAVENOUS

## 2021-09-07 MED ORDER — SODIUM CHLORIDE (PF) 0.9 % IJ SOLN
INTRAMUSCULAR | Status: DC | PRN
  Administered 2021-09-07: 10 mL

## 2021-09-07 MED ORDER — ONDANSETRON HCL 4 MG/2ML IJ SOLN: 4.0000 mg | Freq: Once | INTRAMUSCULAR | Status: DC | PRN

## 2021-09-07 MED ORDER — AMISULPRIDE (ANTIEMETIC) 5 MG/2ML IV SOLN: 10.0000 mg | Freq: Once | INTRAVENOUS | Status: DC | PRN

## 2021-09-07 SURGICAL SUPPLY — 27 items
BLADE CLIPPER SENSICLIP SURGIC (BLADE) ×2 IMPLANT
CNTNR URN SCR LID CUP LEK RST (MISCELLANEOUS) ×1 IMPLANT
CONT SPEC 4OZ STRL OR WHT (MISCELLANEOUS) ×2
COVER BACK TABLE 60X90IN (DRAPES) ×2 IMPLANT
DRSG IV TEGADERM 3.5X4.5 STRL (GAUZE/BANDAGES/DRESSINGS) IMPLANT
DRSG TEGADERM 4X4.75 (GAUZE/BANDAGES/DRESSINGS) ×2 IMPLANT
DRSG TEGADERM 8X12 (GAUZE/BANDAGES/DRESSINGS) ×2 IMPLANT
GAUZE SPONGE 4X4 12PLY STRL (GAUZE/BANDAGES/DRESSINGS) ×2 IMPLANT
GLOVE BIO SURGEON STRL SZ7.5 (GLOVE) ×2 IMPLANT
GLOVE BIO SURGEON STRL SZ8 (GLOVE) IMPLANT
GLOVE SURG ORTHO 8.5 STRL (GLOVE) ×2 IMPLANT
IMPL SPACEOAR VUE SYSTEM (Spacer) ×1 IMPLANT
IMPLANT SPACEOAR VUE SYSTEM (Spacer) ×2 IMPLANT
KIT TURNOVER CYSTO (KITS) ×2 IMPLANT
MARKER GOLD PRELOAD 1.2X3 (Urological Implant) ×1 IMPLANT
MARKER SKIN DUAL TIP RULER LAB (MISCELLANEOUS) ×2 IMPLANT
NDL SPNL 22GX3.5 QUINCKE BK (NEEDLE) ×1 IMPLANT
NEEDLE SPNL 22GX3.5 QUINCKE BK (NEEDLE) ×2 IMPLANT
PREP POVIDONE IODINE SPRAY 2OZ (MISCELLANEOUS) ×1 IMPLANT
SEED GOLD PRELOAD 1.2X3 (Urological Implant) ×2 IMPLANT
SHEATH ULTRASOUND LF (SHEATH) ×1 IMPLANT
SHEATH ULTRASOUND LTX NONSTRL (SHEATH) IMPLANT
SURGILUBE 2OZ TUBE FLIPTOP (MISCELLANEOUS) ×2 IMPLANT
SYR 10ML LL (SYRINGE) ×1 IMPLANT
SYR CONTROL 10ML LL (SYRINGE) ×2 IMPLANT
TOWEL OR 17X26 10 PK STRL BLUE (TOWEL DISPOSABLE) ×2 IMPLANT
UNDERPAD 30X36 HEAVY ABSORB (UNDERPADS AND DIAPERS) ×2 IMPLANT

## 2021-09-07 NOTE — Discharge Instructions (Addendum)
Transrectal Ultrasound-Guided Prostate Gold Seed and Biodegradable Gel Placement, Care After  The following information offers guidance on how to care for yourself after your procedure. Your health care provider may also give you more specific instructions. If you have problems or questions, contact your health care provider. What can I expect after the procedure? After the procedure, it is common to have: Light bleeding from the rectum. Bruising or tenderness in the area behind the scrotum (perineum), if the needle was put into your prostate through this area. Small amounts of blood in your urine. This should only last for a few days. Light brown or red semen. This may last for a couple of weeks. Follow these instructions at home: Medicines A prescription pill bottle with an example of a pill.  Take over-the-counter and prescription medicines only as told by your health care provider. If you were prescribed an antibiotic, take it as told by your health care provider. Do not stop taking the antibiotic early, even if you start to feel better. Ask your health care provider if the medicine prescribed to you requires you to avoid driving or using machinery. Eating and drinking Follow instructions from your health care provider about eating or drinking restrictions. Drink enough fluid to keep your urine pale yellow. Managing pain and swelling If directed, put ice on the affected area. To do this: Put ice in a plastic bag. Place a towel between your skin and the bag. Leave the ice on for 20 minutes, 2-3 times a day. Be sure to remove the ice if your skin turns bright red. If you cannot feel pain, heat, or cold, you have a greater risk of damage to the area. Try not to sit directly on the area behind the scrotum. A soft cushion can help with discomfort. Activity If you were given a sedative during the procedure, it can affect you for several hours. Do not drive or operate machinery until your  health care provider says that it is safe. Return to your normal activities as told by your health care provider. Ask your health care provider what activities are safe for you. Follow instructions from your health care provider about when it is safe for you to engage in sexual activity. General instructions Plan to have a responsible adult care for you for the time you are told after you leave the hospital or clinic. Do not take baths, swim, or use a hot tub until your health care provider approves. Ask your health care provider if you may take showers. You may only be allowed to take sponge baths. Keep all follow-up visits. Contact a health care provider if: You have a fever or chills. You have more blood in your urine. You have blood in your urine for more than 2-3 days after the procedure. You have trouble passing urine or having a bowel movement. You have pain or burning when urinating. You have nausea or you vomit. Get help right away if: You have severe pain that does not get better with medicine. Your urine is bright red. You cannot urinate. You have rectal bleeding that gets worse. You have shortness of breath. Summary After the procedure, you may have blood in your urine and light bleeding from the rectum. Return to your normal activities as told by your health care provider. Ask your health care provider what activities are safe for you. Take over-the-counter and prescription medicines only as told by your health care provider. Contact your health care provider right away if  your urine is bright red or you cannot pass urine. This information is not intended to replace advice given to you by your health care provider. Make sure you discuss any questions you have with your health care provider. Document Revised: 06/17/2020 Document Reviewed: 06/17/2020 Elsevier Patient Education  [place] Instructions  Activity: Get plenty of rest for  the remainder of the day. A responsible individual must stay with you for 24 hours following the procedure.  For the next 24 hours, DO NOT: -Drive a car -Paediatric nurse -Drink alcoholic beverages -Take any medication unless instructed by your physician -Make any legal decisions or sign important papers.  Meals: Start with liquid foods such as gelatin or soup. Progress to regular foods as tolerated. Avoid greasy, spicy, heavy foods. If nausea and/or vomiting occur, drink only clear liquids until the nausea and/or vomiting subsides. Call your physician if vomiting continues.  Special Instructions/Symptoms: Your throat may feel dry or sore from the anesthesia or the breathing tube placed in your throat during surgery. If this causes discomfort, gargle with warm salt water. The discomfort should disappear within 24 hours.      No acetaminophen/Tylenol until after 2 pm today if needed.

## 2021-09-07 NOTE — Anesthesia Preprocedure Evaluation (Addendum)
Anesthesia Evaluation  Patient identified by MRN, date of birth, ID band Patient awake    Reviewed: Allergy & Precautions, NPO status , Patient's Chart, lab work & pertinent test results  Airway Mallampati: II  TM Distance: >3 FB Neck ROM: Full   Comment: Mass on upper right lateral hard palate Dental no notable dental hx.    Pulmonary neg pulmonary ROS,    Pulmonary exam normal breath sounds clear to auscultation       Cardiovascular hypertension, Pt. on medications negative cardio ROS Normal cardiovascular exam Rhythm:Regular Rate:Normal     Neuro/Psych negative neurological ROS  negative psych ROS   GI/Hepatic negative GI ROS, Neg liver ROS,   Endo/Other  prediabetes  Renal/GU negative Renal ROS  negative genitourinary   Musculoskeletal negative musculoskeletal ROS (+)   Abdominal   Peds negative pediatric ROS (+)  Hematology negative hematology ROS (+)   Anesthesia Other Findings Prostate ca  Reproductive/Obstetrics negative OB ROS                            Anesthesia Physical Anesthesia Plan  ASA: 3  Anesthesia Plan: MAC   Post-op Pain Management: Tylenol PO (pre-op)*   Induction: Intravenous  PONV Risk Score and Plan: 1 and Treatment may vary due to age or medical condition, Propofol infusion and TIVA  Airway Management Planned: Natural Airway and Simple Face Mask  Additional Equipment:   Intra-op Plan:   Post-operative Plan: Extubation in OR  Informed Consent: I have reviewed the patients History and Physical, chart, labs and discussed the procedure including the risks, benefits and alternatives for the proposed anesthesia with the patient or authorized representative who has indicated his/her understanding and acceptance.     Dental advisory given  Plan Discussed with: CRNA, Anesthesiologist and Surgeon  Anesthesia Plan Comments:         Anesthesia  Quick Evaluation

## 2021-09-07 NOTE — Op Note (Addendum)
Preoperative diagnosis: Prostate cancer Postoperative diagnosis: Prostate cancer  Procedure: Transrectal ultrasound-guided transperineal placement of gold seed prostate fiducial markers and SpaceOAR biodegradable gel  Surgeon: Junious Silk  Anesthesia: General  Indication for procedure: 76 yo male who is preparing to start external beam radiation.  He presents today for the above.  Findings: Normal-appearing prostate with intact capsule and no hypoechoic. Seminal vesicles appeared normal.  Description of procedure: After consent was obtained patient brought to the operating room.  After adequate anesthesia he was placed in lithotomy position and the scrotum supported superiorly.  The perineum was prepped.  Ultrasound probe inserted rectally.  Prostate imaged in the axial and sagittal views.  Local anesthetic was used to infiltrate the perineum down to the prostate apex.  The gold seed markers were passed transperineally under ultrasound guidance placing three total with one in the right base, right apex and left mid.  The 18-gauge needle was then inserted approximately 1 to 2 cm anterior to the anal opening and directed under ultrasonic guidance into the perirectal fat between the anterior rectal wall and the prostate capsule down to the mid-gland. Midline needle position was confirmed in the sagittal and axial views to verify the tip was in the perirectal fat.  Small amounts of saline were injected to hydrodissect the space between the prostate and the anterior rectal wall.  Axial imaging was viewed to confirm the needle was in the correct location in the mid gland and centered.  Aspiration confirmed no intravascular access.  The saline syringe was carefully disconnected maintaining the desired needle position and the hydrogel was attached to the needle.  Under ultrasound guidance in the sagittal view a smooth continuous injection was done over about 12 seconds delivering the hydrogel into the space  between the prostate and rectal wall.  The needle was withdrawn.  A dressing was placed and the patient was awakened and taken to the cover room in stable condition.  Complications: None  Blood loss: Minimal  Specimens: None  Drains: None  Disposition: Patient stable to PACU

## 2021-09-07 NOTE — Transfer of Care (Signed)
Immediate Anesthesia Transfer of Care Note  Patient: James Pace  Procedure(s) Performed: Procedure(s) (LRB): GOLD SEED IMPLANT (N/A) SPACE OAR INSTILLATION (N/A)  Patient Location: PACU  Anesthesia Type: MAC  Level of Consciousness: awake, sedated, patient cooperative and responds to stimulation  Airway & Oxygen Therapy: Patient Spontanous Breathing and Patient on RA   Post-op Assessment: Report given to PACU RN, Post -op Vital signs reviewed and stable and Patient moving all extremities  Post vital signs: Reviewed and stable  Complications: No apparent anesthesia complications

## 2021-09-07 NOTE — Anesthesia Postprocedure Evaluation (Signed)
Anesthesia Post Note  Patient: James Pace  Procedure(s) Performed: GOLD SEED IMPLANT (Prostate) SPACE OAR INSTILLATION (Prostate)     Patient location during evaluation: PACU Anesthesia Type: MAC Level of consciousness: awake and alert Pain management: pain level controlled Vital Signs Assessment: post-procedure vital signs reviewed and stable Respiratory status: spontaneous breathing and respiratory function stable Cardiovascular status: stable Postop Assessment: no apparent nausea or vomiting Anesthetic complications: no   No notable events documented.  Last Vitals:  Vitals:   09/07/21 1015 09/07/21 1030  BP: 110/74 107/68  Pulse: (!) 52 (!) 58  Resp: 12 20  Temp: (!) 36.2 C   SpO2: 97% 98%    Last Pain:  Vitals:   09/07/21 1030  TempSrc:   PainSc: 2                  Candra R Adesuwa Osgood

## 2021-09-07 NOTE — Interval H&P Note (Signed)
History and Physical Interval Note:  09/07/2021 8:20 AM  James Pace  has presented today for surgery, with the diagnosis of PROSTATE CANCER.  The various methods of treatment have been discussed with the patient and family. After consideration of risks, benefits and other options for treatment, the patient has consented to  Procedure(s): GOLD SEED IMPLANT (N/A) SPACE OAR INSTILLATION (N/A) as a surgical intervention. I drew patient a picture of the and discussed with the patient the nature, potential benefits, risks and alternatives to transperineal placement of gold seed prostate fiducial markers and SpaceOAR biodegradable gel, including side effects of the proposed treatment, the likelihood of the patient achieving the goals of the procedure, and any potential problems that might occur during the procedure or recuperation.  We discussed risk of rectal, urethral or bladder injury among others.  We discussed risk of bladder or bowel urgency and frequency among others.  All questions answered. Patient elects to proceed. The patient's history has been reviewed, patient examined, no change in status, stable for surgery.  I have reviewed the patient's chart and labs.  Questions were answered to the patient's satisfaction.  He has been well without any dysuria, hematuria, fever, cough cold or congestion.   [name]

## 2021-09-07 NOTE — Anesthesia Procedure Notes (Signed)
Procedure Name: MAC Date/Time: 09/07/2021 9:40 AM Performed by: Justice Rocher, CRNA Pre-anesthesia Checklist: Timeout performed, Patient being monitored, Suction available, Emergency Drugs available and Patient identified Patient Re-evaluated:Patient Re-evaluated prior to induction Oxygen Delivery Method: Simple face mask Preoxygenation: Pre-oxygenation with 100% oxygen Induction Type: IV induction Placement Confirmation: breath sounds checked- equal and bilateral, CO2 detector and positive ETCO2

## 2021-09-08 ENCOUNTER — Encounter (HOSPITAL_BASED_OUTPATIENT_CLINIC_OR_DEPARTMENT_OTHER): Payer: Self-pay | Admitting: Urology

## 2021-09-14 ENCOUNTER — Telehealth: Payer: Self-pay | Admitting: *Deleted

## 2021-09-14 NOTE — Telephone Encounter (Signed)
CALLED PATIENT TO REMIND OF SIM APPT. FOR 09-16-21- ARRIVAL TIME- 12:45 PM @  [facility], INFORMED PATIENT TO ARRIVE WITH A FULL BLADDER AND AN EMPTY BOWEL, LVM FOR A RETURN CALL

## 2021-09-16 ENCOUNTER — Other Ambulatory Visit: Payer: Self-pay

## 2021-09-16 ENCOUNTER — Ambulatory Visit
Admission: RE | Admit: 2021-09-16 | Discharge: 2021-09-16 | Disposition: A | Discharge status: Home / Self Care | Payer: PPO | Source: Ambulatory Visit | Attending: Radiation Oncology | Admitting: Radiation Oncology

## 2021-09-16 DIAGNOSIS — Z510 Encounter for antineoplastic radiation therapy: Secondary | ICD-10-CM | POA: Insufficient documentation

## 2021-09-16 DIAGNOSIS — C61 Malignant neoplasm of prostate: Secondary | ICD-10-CM | POA: Diagnosis not present

## 2021-09-16 DIAGNOSIS — Z191 Hormone sensitive malignancy status: Secondary | ICD-10-CM | POA: Diagnosis not present

## 2021-09-16 NOTE — Progress Notes (Signed)
  Radiation Oncology         [phone]) [phone] ________________________________  Name: James Pace MRN: [id number]  Date: 09/16/2021  DOB: 1945-08-12  SIMULATION AND TREATMENT PLANNING NOTE    ICD-10-CM   1. Malignant neoplasm of prostate ([place])  C61       DIAGNOSIS:   75 y.o. gentleman with Stage T1c adenocarcinoma of the prostate with Gleason score of 3+4, and PSA of 6.69.  NARRATIVE:  The patient was brought to the [facility].  Identity was confirmed.  All relevant records and images related to the planned course of therapy were reviewed.  The patient freely provided informed written consent to proceed with treatment after reviewing the details related to the planned course of therapy. The consent form was witnessed and verified by the simulation staff.  Then, the patient was set-up in a stable reproducible supine position for radiation therapy.  A vacuum lock pillow device was custom fabricated to position his legs in a reproducible immobilized position.  Then, supervised the performance of a urethrogram under sterile conditions to identify the prostatic apex.  CT images were obtained.  Surface markings were placed.  The CT images were loaded into the planning software.  Then the prostate target and avoidance structures including the rectum, bladder, bowel and hips were contoured.  Treatment planning then occurred.  The radiation prescription was entered and confirmed.  A total of one complex treatment devices was fabricated. I have requested : Intensity Modulated Radiotherapy (IMRT) is medically necessary for this case for the following reason:  Rectal sparing.  I have requested daily cone beam CT volumetric image gudiance to track gold fiducial posiitoning along with bladder and rectal filling, this is medically necessary to assure accurate positioning of high dose radiation.  PLAN:  The patient will receive 70 Gy in 28 fractions.  ________________________________  Sheral Apley  Tammi Klippel, M.D.

## 2021-09-23 DIAGNOSIS — C61 Malignant neoplasm of prostate: Secondary | ICD-10-CM | POA: Diagnosis not present

## 2021-09-23 DIAGNOSIS — Z510 Encounter for antineoplastic radiation therapy: Secondary | ICD-10-CM | POA: Diagnosis not present

## 2021-09-23 DIAGNOSIS — Z191 Hormone sensitive malignancy status: Secondary | ICD-10-CM | POA: Diagnosis not present

## 2021-09-24 DIAGNOSIS — C61 Malignant neoplasm of prostate: Secondary | ICD-10-CM | POA: Diagnosis not present

## 2021-09-24 DIAGNOSIS — Z191 Hormone sensitive malignancy status: Secondary | ICD-10-CM | POA: Diagnosis not present

## 2021-09-28 ENCOUNTER — Other Ambulatory Visit: Payer: Self-pay

## 2021-09-28 ENCOUNTER — Ambulatory Visit
Admission: RE | Admit: 2021-09-28 | Discharge: 2021-09-28 | Disposition: A | Discharge status: Home / Self Care | Payer: PPO | Source: Ambulatory Visit | Attending: Radiation Oncology | Admitting: Radiation Oncology

## 2021-09-28 DIAGNOSIS — Z191 Hormone sensitive malignancy status: Secondary | ICD-10-CM | POA: Diagnosis not present

## 2021-09-28 DIAGNOSIS — Z510 Encounter for antineoplastic radiation therapy: Secondary | ICD-10-CM | POA: Diagnosis not present

## 2021-09-28 DIAGNOSIS — C61 Malignant neoplasm of prostate: Secondary | ICD-10-CM | POA: Diagnosis not present

## 2021-09-28 LAB — RAD ONC ARIA SESSION SUMMARY
Course Elapsed Days: 0
Plan Fractions Treated to Date: 1
Plan Prescribed Dose Per Fraction: 2.5 Gy
Plan Total Fractions Prescribed: 28
Plan Total Prescribed Dose: 70 Gy
Reference Point Dosage Given to Date: 2.5 Gy
Reference Point Session Dosage Given: 2.5 Gy
Session Number: 1

## 2021-09-29 ENCOUNTER — Other Ambulatory Visit: Payer: Self-pay

## 2021-09-29 ENCOUNTER — Ambulatory Visit
Admission: RE | Admit: 2021-09-29 | Discharge: 2021-09-29 | Disposition: A | Discharge status: Home / Self Care | Payer: PPO | Source: Ambulatory Visit | Attending: Radiation Oncology | Admitting: Radiation Oncology

## 2021-09-29 DIAGNOSIS — Z510 Encounter for antineoplastic radiation therapy: Secondary | ICD-10-CM | POA: Diagnosis not present

## 2021-09-29 DIAGNOSIS — Z191 Hormone sensitive malignancy status: Secondary | ICD-10-CM | POA: Diagnosis not present

## 2021-09-29 DIAGNOSIS — C61 Malignant neoplasm of prostate: Secondary | ICD-10-CM | POA: Diagnosis not present

## 2021-09-29 LAB — RAD ONC ARIA SESSION SUMMARY
Course Elapsed Days: 1
Plan Fractions Treated to Date: 2
Plan Prescribed Dose Per Fraction: 2.5 Gy
Plan Total Fractions Prescribed: 28
Plan Total Prescribed Dose: 70 Gy
Reference Point Dosage Given to Date: 5 Gy
Reference Point Session Dosage Given: 2.5 Gy
Session Number: 2

## 2021-09-30 ENCOUNTER — Other Ambulatory Visit: Payer: Self-pay

## 2021-09-30 ENCOUNTER — Ambulatory Visit
Admission: RE | Admit: 2021-09-30 | Discharge: 2021-09-30 | Disposition: A | Discharge status: Home / Self Care | Payer: PPO | Source: Ambulatory Visit | Attending: Radiation Oncology | Admitting: Radiation Oncology

## 2021-09-30 DIAGNOSIS — C61 Malignant neoplasm of prostate: Secondary | ICD-10-CM | POA: Diagnosis not present

## 2021-09-30 DIAGNOSIS — Z191 Hormone sensitive malignancy status: Secondary | ICD-10-CM | POA: Diagnosis not present

## 2021-09-30 DIAGNOSIS — Z510 Encounter for antineoplastic radiation therapy: Secondary | ICD-10-CM | POA: Diagnosis not present

## 2021-09-30 LAB — RAD ONC ARIA SESSION SUMMARY
Course Elapsed Days: 2
Plan Fractions Treated to Date: 3
Plan Prescribed Dose Per Fraction: 2.5 Gy
Plan Total Fractions Prescribed: 28
Plan Total Prescribed Dose: 70 Gy
Reference Point Dosage Given to Date: 7.5 Gy
Reference Point Session Dosage Given: 2.5 Gy
Session Number: 3

## 2021-10-01 ENCOUNTER — Other Ambulatory Visit: Payer: Self-pay

## 2021-10-01 ENCOUNTER — Other Ambulatory Visit: Payer: Self-pay | Admitting: Radiation Oncology

## 2021-10-01 ENCOUNTER — Ambulatory Visit
Admission: RE | Admit: 2021-10-01 | Discharge: 2021-10-01 | Disposition: A | Discharge status: Home / Self Care | Payer: PPO | Source: Ambulatory Visit | Attending: Radiation Oncology | Admitting: Radiation Oncology

## 2021-10-01 DIAGNOSIS — Z191 Hormone sensitive malignancy status: Secondary | ICD-10-CM | POA: Diagnosis not present

## 2021-10-01 DIAGNOSIS — Z510 Encounter for antineoplastic radiation therapy: Secondary | ICD-10-CM | POA: Diagnosis not present

## 2021-10-01 DIAGNOSIS — C61 Malignant neoplasm of prostate: Secondary | ICD-10-CM | POA: Diagnosis not present

## 2021-10-01 LAB — RAD ONC ARIA SESSION SUMMARY
Course Elapsed Days: 3
Plan Fractions Treated to Date: 4
Plan Prescribed Dose Per Fraction: 2.5 Gy
Plan Total Fractions Prescribed: 28
Plan Total Prescribed Dose: 70 Gy
Reference Point Dosage Given to Date: 10 Gy
Reference Point Session Dosage Given: 2.5 Gy
Session Number: 4

## 2021-10-04 ENCOUNTER — Other Ambulatory Visit: Payer: Self-pay

## 2021-10-04 ENCOUNTER — Ambulatory Visit
Admission: RE | Admit: 2021-10-04 | Discharge: 2021-10-04 | Disposition: A | Discharge status: Home / Self Care | Payer: PPO | Source: Ambulatory Visit | Attending: Radiation Oncology | Admitting: Radiation Oncology

## 2021-10-04 DIAGNOSIS — Z510 Encounter for antineoplastic radiation therapy: Secondary | ICD-10-CM | POA: Insufficient documentation

## 2021-10-04 DIAGNOSIS — Z191 Hormone sensitive malignancy status: Secondary | ICD-10-CM | POA: Diagnosis not present

## 2021-10-04 DIAGNOSIS — C61 Malignant neoplasm of prostate: Secondary | ICD-10-CM | POA: Insufficient documentation

## 2021-10-04 LAB — RAD ONC ARIA SESSION SUMMARY
Course Elapsed Days: 6
Plan Fractions Treated to Date: 5
Plan Prescribed Dose Per Fraction: 2.5 Gy
Plan Total Fractions Prescribed: 28
Plan Total Prescribed Dose: 70 Gy
Reference Point Dosage Given to Date: 12.5 Gy
Reference Point Session Dosage Given: 2.5 Gy
Session Number: 5

## 2021-10-06 ENCOUNTER — Other Ambulatory Visit: Payer: Self-pay

## 2021-10-06 ENCOUNTER — Ambulatory Visit
Admission: RE | Admit: 2021-10-06 | Discharge: 2021-10-06 | Disposition: A | Discharge status: Home / Self Care | Payer: PPO | Source: Ambulatory Visit | Attending: Radiation Oncology | Admitting: Radiation Oncology

## 2021-10-06 DIAGNOSIS — Z191 Hormone sensitive malignancy status: Secondary | ICD-10-CM | POA: Diagnosis not present

## 2021-10-06 DIAGNOSIS — Z510 Encounter for antineoplastic radiation therapy: Secondary | ICD-10-CM | POA: Diagnosis not present

## 2021-10-06 DIAGNOSIS — C61 Malignant neoplasm of prostate: Secondary | ICD-10-CM | POA: Diagnosis not present

## 2021-10-06 LAB — RAD ONC ARIA SESSION SUMMARY
Course Elapsed Days: 8
Plan Fractions Treated to Date: 6
Plan Prescribed Dose Per Fraction: 2.5 Gy
Plan Total Fractions Prescribed: 28
Plan Total Prescribed Dose: 70 Gy
Reference Point Dosage Given to Date: 15 Gy
Reference Point Session Dosage Given: 2.5 Gy
Session Number: 6

## 2021-10-07 ENCOUNTER — Ambulatory Visit
Admission: RE | Admit: 2021-10-07 | Discharge: 2021-10-07 | Disposition: A | Discharge status: Home / Self Care | Payer: PPO | Source: Ambulatory Visit | Attending: Radiation Oncology | Admitting: Radiation Oncology

## 2021-10-07 ENCOUNTER — Other Ambulatory Visit: Payer: Self-pay

## 2021-10-07 DIAGNOSIS — Z191 Hormone sensitive malignancy status: Secondary | ICD-10-CM | POA: Diagnosis not present

## 2021-10-07 DIAGNOSIS — Z510 Encounter for antineoplastic radiation therapy: Secondary | ICD-10-CM | POA: Diagnosis not present

## 2021-10-07 DIAGNOSIS — C61 Malignant neoplasm of prostate: Secondary | ICD-10-CM | POA: Diagnosis not present

## 2021-10-07 LAB — RAD ONC ARIA SESSION SUMMARY
Course Elapsed Days: 9
Plan Fractions Treated to Date: 7
Plan Prescribed Dose Per Fraction: 2.5 Gy
Plan Total Fractions Prescribed: 28
Plan Total Prescribed Dose: 70 Gy
Reference Point Dosage Given to Date: 17.5 Gy
Reference Point Session Dosage Given: 2.5 Gy
Session Number: 7

## 2021-10-08 ENCOUNTER — Ambulatory Visit
Admission: RE | Admit: 2021-10-08 | Discharge: 2021-10-08 | Disposition: A | Discharge status: Home / Self Care | Payer: PPO | Source: Ambulatory Visit | Attending: Radiation Oncology | Admitting: Radiation Oncology

## 2021-10-08 ENCOUNTER — Other Ambulatory Visit: Payer: Self-pay

## 2021-10-08 DIAGNOSIS — Z191 Hormone sensitive malignancy status: Secondary | ICD-10-CM | POA: Diagnosis not present

## 2021-10-08 DIAGNOSIS — C61 Malignant neoplasm of prostate: Secondary | ICD-10-CM | POA: Diagnosis not present

## 2021-10-08 DIAGNOSIS — Z510 Encounter for antineoplastic radiation therapy: Secondary | ICD-10-CM | POA: Diagnosis not present

## 2021-10-08 LAB — RAD ONC ARIA SESSION SUMMARY
Course Elapsed Days: 10
Plan Fractions Treated to Date: 8
Plan Prescribed Dose Per Fraction: 2.5 Gy
Plan Total Fractions Prescribed: 28
Plan Total Prescribed Dose: 70 Gy
Reference Point Dosage Given to Date: 20 Gy
Reference Point Session Dosage Given: 2.5 Gy
Session Number: 8

## 2021-10-11 ENCOUNTER — Other Ambulatory Visit: Payer: Self-pay

## 2021-10-11 ENCOUNTER — Ambulatory Visit
Admission: RE | Admit: 2021-10-11 | Discharge: 2021-10-11 | Disposition: A | Discharge status: Home / Self Care | Payer: PPO | Source: Ambulatory Visit | Attending: Radiation Oncology | Admitting: Radiation Oncology

## 2021-10-11 DIAGNOSIS — Z510 Encounter for antineoplastic radiation therapy: Secondary | ICD-10-CM | POA: Diagnosis not present

## 2021-10-11 DIAGNOSIS — C61 Malignant neoplasm of prostate: Secondary | ICD-10-CM | POA: Diagnosis not present

## 2021-10-11 DIAGNOSIS — Z191 Hormone sensitive malignancy status: Secondary | ICD-10-CM | POA: Diagnosis not present

## 2021-10-11 LAB — RAD ONC ARIA SESSION SUMMARY
Course Elapsed Days: 13
Plan Fractions Treated to Date: 9
Plan Prescribed Dose Per Fraction: 2.5 Gy
Plan Total Fractions Prescribed: 28
Plan Total Prescribed Dose: 70 Gy
Reference Point Dosage Given to Date: 22.5 Gy
Reference Point Session Dosage Given: 2.5 Gy
Session Number: 9

## 2021-10-12 ENCOUNTER — Ambulatory Visit
Admission: RE | Admit: 2021-10-12 | Discharge: 2021-10-12 | Disposition: A | Discharge status: Home / Self Care | Payer: PPO | Source: Ambulatory Visit | Attending: Radiation Oncology | Admitting: Radiation Oncology

## 2021-10-12 ENCOUNTER — Other Ambulatory Visit: Payer: Self-pay

## 2021-10-12 DIAGNOSIS — Z510 Encounter for antineoplastic radiation therapy: Secondary | ICD-10-CM | POA: Diagnosis not present

## 2021-10-12 DIAGNOSIS — Z191 Hormone sensitive malignancy status: Secondary | ICD-10-CM | POA: Diagnosis not present

## 2021-10-12 DIAGNOSIS — C61 Malignant neoplasm of prostate: Secondary | ICD-10-CM | POA: Diagnosis not present

## 2021-10-12 LAB — RAD ONC ARIA SESSION SUMMARY
Course Elapsed Days: 14
Plan Fractions Treated to Date: 10
Plan Prescribed Dose Per Fraction: 2.5 Gy
Plan Total Fractions Prescribed: 28
Plan Total Prescribed Dose: 70 Gy
Reference Point Dosage Given to Date: 25 Gy
Reference Point Session Dosage Given: 2.5 Gy
Session Number: 10

## 2021-10-13 ENCOUNTER — Other Ambulatory Visit: Payer: Self-pay

## 2021-10-13 ENCOUNTER — Ambulatory Visit
Admission: RE | Admit: 2021-10-13 | Discharge: 2021-10-13 | Disposition: A | Discharge status: Home / Self Care | Payer: PPO | Source: Ambulatory Visit | Attending: Radiation Oncology | Admitting: Radiation Oncology

## 2021-10-13 DIAGNOSIS — C61 Malignant neoplasm of prostate: Secondary | ICD-10-CM | POA: Diagnosis not present

## 2021-10-13 DIAGNOSIS — Z510 Encounter for antineoplastic radiation therapy: Secondary | ICD-10-CM | POA: Diagnosis not present

## 2021-10-13 DIAGNOSIS — Z191 Hormone sensitive malignancy status: Secondary | ICD-10-CM | POA: Diagnosis not present

## 2021-10-13 LAB — RAD ONC ARIA SESSION SUMMARY
Course Elapsed Days: 15
Plan Fractions Treated to Date: 11
Plan Prescribed Dose Per Fraction: 2.5 Gy
Plan Total Fractions Prescribed: 28
Plan Total Prescribed Dose: 70 Gy
Reference Point Dosage Given to Date: 27.5 Gy
Reference Point Session Dosage Given: 2.5 Gy
Session Number: 11

## 2021-10-14 ENCOUNTER — Ambulatory Visit
Admission: RE | Admit: 2021-10-14 | Discharge: 2021-10-14 | Disposition: A | Discharge status: Home / Self Care | Payer: PPO | Source: Ambulatory Visit | Attending: Radiation Oncology | Admitting: Radiation Oncology

## 2021-10-14 ENCOUNTER — Other Ambulatory Visit: Payer: Self-pay

## 2021-10-14 DIAGNOSIS — Z510 Encounter for antineoplastic radiation therapy: Secondary | ICD-10-CM | POA: Diagnosis not present

## 2021-10-14 DIAGNOSIS — C61 Malignant neoplasm of prostate: Secondary | ICD-10-CM | POA: Diagnosis not present

## 2021-10-14 DIAGNOSIS — Z191 Hormone sensitive malignancy status: Secondary | ICD-10-CM | POA: Diagnosis not present

## 2021-10-14 LAB — RAD ONC ARIA SESSION SUMMARY
Course Elapsed Days: 16
Plan Fractions Treated to Date: 12
Plan Prescribed Dose Per Fraction: 2.5 Gy
Plan Total Fractions Prescribed: 28
Plan Total Prescribed Dose: 70 Gy
Reference Point Dosage Given to Date: 30 Gy
Reference Point Session Dosage Given: 2.5 Gy
Session Number: 12

## 2021-10-15 ENCOUNTER — Other Ambulatory Visit: Payer: Self-pay

## 2021-10-15 ENCOUNTER — Ambulatory Visit
Admission: RE | Admit: 2021-10-15 | Discharge: 2021-10-15 | Disposition: A | Discharge status: Home / Self Care | Payer: PPO | Source: Ambulatory Visit | Attending: Radiation Oncology | Admitting: Radiation Oncology

## 2021-10-15 DIAGNOSIS — Z510 Encounter for antineoplastic radiation therapy: Secondary | ICD-10-CM | POA: Diagnosis not present

## 2021-10-15 DIAGNOSIS — Z191 Hormone sensitive malignancy status: Secondary | ICD-10-CM | POA: Diagnosis not present

## 2021-10-15 DIAGNOSIS — C61 Malignant neoplasm of prostate: Secondary | ICD-10-CM | POA: Diagnosis not present

## 2021-10-15 LAB — RAD ONC ARIA SESSION SUMMARY
Course Elapsed Days: 17
Plan Fractions Treated to Date: 13
Plan Prescribed Dose Per Fraction: 2.5 Gy
Plan Total Fractions Prescribed: 28
Plan Total Prescribed Dose: 70 Gy
Reference Point Dosage Given to Date: 32.5 Gy
Reference Point Session Dosage Given: 2.5 Gy
Session Number: 13

## 2021-10-18 ENCOUNTER — Ambulatory Visit
Admission: RE | Admit: 2021-10-18 | Discharge: 2021-10-18 | Disposition: A | Discharge status: Home / Self Care | Payer: PPO | Source: Ambulatory Visit | Attending: Radiation Oncology | Admitting: Radiation Oncology

## 2021-10-18 ENCOUNTER — Other Ambulatory Visit: Payer: Self-pay

## 2021-10-18 DIAGNOSIS — Z510 Encounter for antineoplastic radiation therapy: Secondary | ICD-10-CM | POA: Diagnosis not present

## 2021-10-18 DIAGNOSIS — Z191 Hormone sensitive malignancy status: Secondary | ICD-10-CM | POA: Diagnosis not present

## 2021-10-18 DIAGNOSIS — C61 Malignant neoplasm of prostate: Secondary | ICD-10-CM | POA: Diagnosis not present

## 2021-10-18 LAB — RAD ONC ARIA SESSION SUMMARY
Course Elapsed Days: 20
Plan Fractions Treated to Date: 14
Plan Prescribed Dose Per Fraction: 2.5 Gy
Plan Total Fractions Prescribed: 28
Plan Total Prescribed Dose: 70 Gy
Reference Point Dosage Given to Date: 35 Gy
Reference Point Session Dosage Given: 2.5 Gy
Session Number: 14

## 2021-10-19 ENCOUNTER — Ambulatory Visit
Admission: RE | Admit: 2021-10-19 | Discharge: 2021-10-19 | Disposition: A | Discharge status: Home / Self Care | Payer: PPO | Source: Ambulatory Visit | Attending: Radiation Oncology | Admitting: Radiation Oncology

## 2021-10-19 ENCOUNTER — Other Ambulatory Visit: Payer: Self-pay

## 2021-10-19 DIAGNOSIS — Z510 Encounter for antineoplastic radiation therapy: Secondary | ICD-10-CM | POA: Diagnosis not present

## 2021-10-19 DIAGNOSIS — Z191 Hormone sensitive malignancy status: Secondary | ICD-10-CM | POA: Diagnosis not present

## 2021-10-19 DIAGNOSIS — C61 Malignant neoplasm of prostate: Secondary | ICD-10-CM | POA: Diagnosis not present

## 2021-10-19 LAB — RAD ONC ARIA SESSION SUMMARY
Course Elapsed Days: 21
Plan Fractions Treated to Date: 15
Plan Prescribed Dose Per Fraction: 2.5 Gy
Plan Total Fractions Prescribed: 28
Plan Total Prescribed Dose: 70 Gy
Reference Point Dosage Given to Date: 37.5 Gy
Reference Point Session Dosage Given: 2.5 Gy
Session Number: 15

## 2021-10-20 ENCOUNTER — Other Ambulatory Visit: Payer: Self-pay

## 2021-10-20 ENCOUNTER — Ambulatory Visit
Admission: RE | Admit: 2021-10-20 | Discharge: 2021-10-20 | Disposition: A | Discharge status: Home / Self Care | Payer: PPO | Source: Ambulatory Visit | Attending: Radiation Oncology | Admitting: Radiation Oncology

## 2021-10-20 DIAGNOSIS — Z191 Hormone sensitive malignancy status: Secondary | ICD-10-CM | POA: Diagnosis not present

## 2021-10-20 DIAGNOSIS — Z510 Encounter for antineoplastic radiation therapy: Secondary | ICD-10-CM | POA: Diagnosis not present

## 2021-10-20 DIAGNOSIS — C61 Malignant neoplasm of prostate: Secondary | ICD-10-CM | POA: Diagnosis not present

## 2021-10-20 LAB — RAD ONC ARIA SESSION SUMMARY
Course Elapsed Days: 22
Plan Fractions Treated to Date: 16
Plan Prescribed Dose Per Fraction: 2.5 Gy
Plan Total Fractions Prescribed: 28
Plan Total Prescribed Dose: 70 Gy
Reference Point Dosage Given to Date: 40 Gy
Reference Point Session Dosage Given: 2.5 Gy
Session Number: 16

## 2021-10-21 ENCOUNTER — Ambulatory Visit
Admission: RE | Admit: 2021-10-21 | Discharge: 2021-10-21 | Disposition: A | Discharge status: Home / Self Care | Payer: PPO | Source: Ambulatory Visit | Attending: Radiation Oncology | Admitting: Radiation Oncology

## 2021-10-21 ENCOUNTER — Other Ambulatory Visit: Payer: Self-pay

## 2021-10-21 DIAGNOSIS — C61 Malignant neoplasm of prostate: Secondary | ICD-10-CM | POA: Diagnosis not present

## 2021-10-21 DIAGNOSIS — Z510 Encounter for antineoplastic radiation therapy: Secondary | ICD-10-CM | POA: Diagnosis not present

## 2021-10-21 DIAGNOSIS — Z191 Hormone sensitive malignancy status: Secondary | ICD-10-CM | POA: Diagnosis not present

## 2021-10-21 LAB — RAD ONC ARIA SESSION SUMMARY
Course Elapsed Days: 23
Plan Fractions Treated to Date: 17
Plan Prescribed Dose Per Fraction: 2.5 Gy
Plan Total Fractions Prescribed: 28
Plan Total Prescribed Dose: 70 Gy
Reference Point Dosage Given to Date: 42.5 Gy
Reference Point Session Dosage Given: 2.5 Gy
Session Number: 17

## 2021-10-22 ENCOUNTER — Other Ambulatory Visit: Payer: Self-pay

## 2021-10-22 ENCOUNTER — Ambulatory Visit
Admission: RE | Admit: 2021-10-22 | Discharge: 2021-10-22 | Disposition: A | Discharge status: Home / Self Care | Payer: PPO | Source: Ambulatory Visit | Attending: Radiation Oncology | Admitting: Radiation Oncology

## 2021-10-22 ENCOUNTER — Other Ambulatory Visit: Payer: Self-pay | Admitting: Radiation Oncology

## 2021-10-22 DIAGNOSIS — Z510 Encounter for antineoplastic radiation therapy: Secondary | ICD-10-CM | POA: Diagnosis not present

## 2021-10-22 DIAGNOSIS — C61 Malignant neoplasm of prostate: Secondary | ICD-10-CM | POA: Diagnosis not present

## 2021-10-22 DIAGNOSIS — Z191 Hormone sensitive malignancy status: Secondary | ICD-10-CM | POA: Diagnosis not present

## 2021-10-22 LAB — RAD ONC ARIA SESSION SUMMARY
Course Elapsed Days: 24
Plan Fractions Treated to Date: 18
Plan Prescribed Dose Per Fraction: 2.5 Gy
Plan Total Fractions Prescribed: 28
Plan Total Prescribed Dose: 70 Gy
Reference Point Dosage Given to Date: 45 Gy
Reference Point Session Dosage Given: 2.5 Gy
Session Number: 18

## 2021-10-22 MED ORDER — TAMSULOSIN HCL 0.4 MG PO CAPS: 0.4000 mg | ORAL_CAPSULE | Freq: Every day | ORAL | 5 refills | Status: DC

## 2021-10-25 ENCOUNTER — Ambulatory Visit
Admission: RE | Admit: 2021-10-25 | Discharge: 2021-10-25 | Disposition: A | Discharge status: Home / Self Care | Payer: PPO | Source: Ambulatory Visit | Attending: Radiation Oncology | Admitting: Radiation Oncology

## 2021-10-25 ENCOUNTER — Other Ambulatory Visit: Payer: Self-pay

## 2021-10-25 DIAGNOSIS — C61 Malignant neoplasm of prostate: Secondary | ICD-10-CM | POA: Diagnosis not present

## 2021-10-25 DIAGNOSIS — Z191 Hormone sensitive malignancy status: Secondary | ICD-10-CM | POA: Diagnosis not present

## 2021-10-25 DIAGNOSIS — Z510 Encounter for antineoplastic radiation therapy: Secondary | ICD-10-CM | POA: Diagnosis not present

## 2021-10-25 LAB — RAD ONC ARIA SESSION SUMMARY
Course Elapsed Days: 27
Plan Fractions Treated to Date: 19
Plan Prescribed Dose Per Fraction: 2.5 Gy
Plan Total Fractions Prescribed: 28
Plan Total Prescribed Dose: 70 Gy
Reference Point Dosage Given to Date: 47.5 Gy
Reference Point Session Dosage Given: 2.5 Gy
Session Number: 19

## 2021-10-26 ENCOUNTER — Ambulatory Visit
Admission: RE | Admit: 2021-10-26 | Discharge: 2021-10-26 | Disposition: A | Discharge status: Home / Self Care | Payer: PPO | Source: Ambulatory Visit | Attending: Radiation Oncology | Admitting: Radiation Oncology

## 2021-10-26 ENCOUNTER — Other Ambulatory Visit: Payer: Self-pay

## 2021-10-26 DIAGNOSIS — C61 Malignant neoplasm of prostate: Secondary | ICD-10-CM | POA: Diagnosis not present

## 2021-10-26 DIAGNOSIS — Z510 Encounter for antineoplastic radiation therapy: Secondary | ICD-10-CM | POA: Diagnosis not present

## 2021-10-26 DIAGNOSIS — Z191 Hormone sensitive malignancy status: Secondary | ICD-10-CM | POA: Diagnosis not present

## 2021-10-26 LAB — RAD ONC ARIA SESSION SUMMARY
Course Elapsed Days: 28
Plan Fractions Treated to Date: 20
Plan Prescribed Dose Per Fraction: 2.5 Gy
Plan Total Fractions Prescribed: 28
Plan Total Prescribed Dose: 70 Gy
Reference Point Dosage Given to Date: 50 Gy
Reference Point Session Dosage Given: 2.5 Gy
Session Number: 20

## 2021-10-27 ENCOUNTER — Ambulatory Visit
Admission: RE | Admit: 2021-10-27 | Discharge: 2021-10-27 | Disposition: A | Discharge status: Home / Self Care | Payer: PPO | Source: Ambulatory Visit | Attending: Radiation Oncology | Admitting: Radiation Oncology

## 2021-10-27 ENCOUNTER — Other Ambulatory Visit: Payer: Self-pay

## 2021-10-27 DIAGNOSIS — C61 Malignant neoplasm of prostate: Secondary | ICD-10-CM | POA: Diagnosis not present

## 2021-10-27 DIAGNOSIS — Z510 Encounter for antineoplastic radiation therapy: Secondary | ICD-10-CM | POA: Diagnosis not present

## 2021-10-27 DIAGNOSIS — Z191 Hormone sensitive malignancy status: Secondary | ICD-10-CM | POA: Diagnosis not present

## 2021-10-27 LAB — RAD ONC ARIA SESSION SUMMARY
Course Elapsed Days: 29
Plan Fractions Treated to Date: 21
Plan Prescribed Dose Per Fraction: 2.5 Gy
Plan Total Fractions Prescribed: 28
Plan Total Prescribed Dose: 70 Gy
Reference Point Dosage Given to Date: 52.5 Gy
Reference Point Session Dosage Given: 2.5 Gy
Session Number: 21

## 2021-10-28 ENCOUNTER — Other Ambulatory Visit: Payer: Self-pay

## 2021-10-28 ENCOUNTER — Ambulatory Visit
Admission: RE | Admit: 2021-10-28 | Discharge: 2021-10-28 | Disposition: A | Discharge status: Home / Self Care | Payer: PPO | Source: Ambulatory Visit | Attending: Radiation Oncology | Admitting: Radiation Oncology

## 2021-10-28 DIAGNOSIS — Z510 Encounter for antineoplastic radiation therapy: Secondary | ICD-10-CM | POA: Diagnosis not present

## 2021-10-28 DIAGNOSIS — Z191 Hormone sensitive malignancy status: Secondary | ICD-10-CM | POA: Diagnosis not present

## 2021-10-28 DIAGNOSIS — C61 Malignant neoplasm of prostate: Secondary | ICD-10-CM | POA: Diagnosis not present

## 2021-10-28 LAB — RAD ONC ARIA SESSION SUMMARY
Course Elapsed Days: 30
Plan Fractions Treated to Date: 22
Plan Prescribed Dose Per Fraction: 2.5 Gy
Plan Total Fractions Prescribed: 28
Plan Total Prescribed Dose: 70 Gy
Reference Point Dosage Given to Date: 55 Gy
Reference Point Session Dosage Given: 2.5 Gy
Session Number: 22

## 2021-10-29 ENCOUNTER — Ambulatory Visit
Admission: RE | Admit: 2021-10-29 | Discharge: 2021-10-29 | Disposition: A | Discharge status: Home / Self Care | Payer: PPO | Source: Ambulatory Visit | Attending: Radiation Oncology | Admitting: Radiation Oncology

## 2021-10-29 ENCOUNTER — Other Ambulatory Visit: Payer: Self-pay

## 2021-10-29 DIAGNOSIS — C61 Malignant neoplasm of prostate: Secondary | ICD-10-CM | POA: Diagnosis not present

## 2021-10-29 DIAGNOSIS — Z510 Encounter for antineoplastic radiation therapy: Secondary | ICD-10-CM | POA: Diagnosis not present

## 2021-10-29 DIAGNOSIS — Z191 Hormone sensitive malignancy status: Secondary | ICD-10-CM | POA: Diagnosis not present

## 2021-10-29 LAB — RAD ONC ARIA SESSION SUMMARY
Course Elapsed Days: 31
Plan Fractions Treated to Date: 23
Plan Prescribed Dose Per Fraction: 2.5 Gy
Plan Total Fractions Prescribed: 28
Plan Total Prescribed Dose: 70 Gy
Reference Point Dosage Given to Date: 57.5 Gy
Reference Point Session Dosage Given: 2.5 Gy
Session Number: 23

## 2021-11-01 ENCOUNTER — Ambulatory Visit
Admission: RE | Admit: 2021-11-01 | Discharge: 2021-11-01 | Disposition: A | Discharge status: Home / Self Care | Payer: PPO | Source: Ambulatory Visit | Attending: Radiation Oncology | Admitting: Radiation Oncology

## 2021-11-01 ENCOUNTER — Other Ambulatory Visit: Payer: Self-pay

## 2021-11-01 DIAGNOSIS — Z510 Encounter for antineoplastic radiation therapy: Secondary | ICD-10-CM | POA: Diagnosis not present

## 2021-11-01 DIAGNOSIS — Z191 Hormone sensitive malignancy status: Secondary | ICD-10-CM | POA: Diagnosis not present

## 2021-11-01 DIAGNOSIS — C61 Malignant neoplasm of prostate: Secondary | ICD-10-CM | POA: Diagnosis not present

## 2021-11-01 LAB — RAD ONC ARIA SESSION SUMMARY
Course Elapsed Days: 34
Plan Fractions Treated to Date: 24
Plan Prescribed Dose Per Fraction: 2.5 Gy
Plan Total Fractions Prescribed: 28
Plan Total Prescribed Dose: 70 Gy
Reference Point Dosage Given to Date: 60 Gy
Reference Point Session Dosage Given: 2.5 Gy
Session Number: 24

## 2021-11-02 ENCOUNTER — Ambulatory Visit
Admission: RE | Admit: 2021-11-02 | Discharge: 2021-11-02 | Disposition: A | Discharge status: Home / Self Care | Payer: PPO | Source: Ambulatory Visit | Attending: Radiation Oncology | Admitting: Radiation Oncology

## 2021-11-02 ENCOUNTER — Other Ambulatory Visit: Payer: Self-pay

## 2021-11-02 DIAGNOSIS — Z191 Hormone sensitive malignancy status: Secondary | ICD-10-CM | POA: Diagnosis not present

## 2021-11-02 DIAGNOSIS — Z510 Encounter for antineoplastic radiation therapy: Secondary | ICD-10-CM | POA: Insufficient documentation

## 2021-11-02 DIAGNOSIS — C61 Malignant neoplasm of prostate: Secondary | ICD-10-CM | POA: Diagnosis not present

## 2021-11-02 LAB — RAD ONC ARIA SESSION SUMMARY
Course Elapsed Days: 35
Plan Fractions Treated to Date: 25
Plan Prescribed Dose Per Fraction: 2.5 Gy
Plan Total Fractions Prescribed: 28
Plan Total Prescribed Dose: 70 Gy
Reference Point Dosage Given to Date: 62.5 Gy
Reference Point Session Dosage Given: 2.5 Gy
Session Number: 25

## 2021-11-03 ENCOUNTER — Other Ambulatory Visit: Payer: Self-pay

## 2021-11-03 ENCOUNTER — Ambulatory Visit
Admission: RE | Admit: 2021-11-03 | Discharge: 2021-11-03 | Disposition: A | Discharge status: Home / Self Care | Payer: PPO | Source: Ambulatory Visit | Attending: Radiation Oncology | Admitting: Radiation Oncology

## 2021-11-03 DIAGNOSIS — Z510 Encounter for antineoplastic radiation therapy: Secondary | ICD-10-CM | POA: Diagnosis not present

## 2021-11-03 DIAGNOSIS — Z191 Hormone sensitive malignancy status: Secondary | ICD-10-CM | POA: Diagnosis not present

## 2021-11-03 DIAGNOSIS — C61 Malignant neoplasm of prostate: Secondary | ICD-10-CM | POA: Diagnosis not present

## 2021-11-03 LAB — RAD ONC ARIA SESSION SUMMARY
Course Elapsed Days: 36
Plan Fractions Treated to Date: 26
Plan Prescribed Dose Per Fraction: 2.5 Gy
Plan Total Fractions Prescribed: 28
Plan Total Prescribed Dose: 70 Gy
Reference Point Dosage Given to Date: 65 Gy
Reference Point Session Dosage Given: 2.5 Gy
Session Number: 26

## 2021-11-04 ENCOUNTER — Ambulatory Visit
Admission: RE | Admit: 2021-11-04 | Discharge: 2021-11-04 | Disposition: A | Discharge status: Home / Self Care | Payer: PPO | Source: Ambulatory Visit | Attending: Radiation Oncology | Admitting: Radiation Oncology

## 2021-11-04 ENCOUNTER — Other Ambulatory Visit: Payer: Self-pay

## 2021-11-04 DIAGNOSIS — C61 Malignant neoplasm of prostate: Secondary | ICD-10-CM | POA: Diagnosis not present

## 2021-11-04 DIAGNOSIS — Z191 Hormone sensitive malignancy status: Secondary | ICD-10-CM | POA: Diagnosis not present

## 2021-11-04 DIAGNOSIS — Z510 Encounter for antineoplastic radiation therapy: Secondary | ICD-10-CM | POA: Diagnosis not present

## 2021-11-04 LAB — RAD ONC ARIA SESSION SUMMARY
Course Elapsed Days: 37
Plan Fractions Treated to Date: 27
Plan Prescribed Dose Per Fraction: 2.5 Gy
Plan Total Fractions Prescribed: 28
Plan Total Prescribed Dose: 70 Gy
Reference Point Dosage Given to Date: 67.5 Gy
Reference Point Session Dosage Given: 2.5 Gy
Session Number: 27

## 2021-11-05 ENCOUNTER — Ambulatory Visit
Admission: RE | Admit: 2021-11-05 | Discharge: 2021-11-05 | Disposition: A | Discharge status: Home / Self Care | Payer: PPO | Source: Ambulatory Visit | Attending: Radiation Oncology | Admitting: Radiation Oncology

## 2021-11-05 ENCOUNTER — Other Ambulatory Visit: Payer: Self-pay

## 2021-11-05 ENCOUNTER — Encounter: Payer: Self-pay | Admitting: Urology

## 2021-11-05 DIAGNOSIS — C61 Malignant neoplasm of prostate: Secondary | ICD-10-CM | POA: Diagnosis not present

## 2021-11-05 DIAGNOSIS — Z191 Hormone sensitive malignancy status: Secondary | ICD-10-CM | POA: Diagnosis not present

## 2021-11-05 DIAGNOSIS — Z510 Encounter for antineoplastic radiation therapy: Secondary | ICD-10-CM | POA: Diagnosis not present

## 2021-11-05 LAB — RAD ONC ARIA SESSION SUMMARY
Course Elapsed Days: 38
Plan Fractions Treated to Date: 28
Plan Prescribed Dose Per Fraction: 2.5 Gy
Plan Total Fractions Prescribed: 28
Plan Total Prescribed Dose: 70 Gy
Reference Point Dosage Given to Date: 70 Gy
Reference Point Session Dosage Given: 2.5 Gy
Session Number: 28

## 2021-12-08 DIAGNOSIS — C61 Malignant neoplasm of prostate: Secondary | ICD-10-CM | POA: Diagnosis not present

## 2021-12-08 DIAGNOSIS — R3912 Poor urinary stream: Secondary | ICD-10-CM | POA: Diagnosis not present

## 2021-12-16 DIAGNOSIS — Z23 Encounter for immunization: Secondary | ICD-10-CM | POA: Diagnosis not present

## 2021-12-16 DIAGNOSIS — Z0000 Encounter for general adult medical examination without abnormal findings: Secondary | ICD-10-CM | POA: Diagnosis not present

## 2021-12-16 DIAGNOSIS — E7800 Pure hypercholesterolemia, unspecified: Secondary | ICD-10-CM | POA: Diagnosis not present

## 2021-12-16 DIAGNOSIS — I10 Essential (primary) hypertension: Secondary | ICD-10-CM | POA: Diagnosis not present

## 2021-12-16 DIAGNOSIS — R7301 Impaired fasting glucose: Secondary | ICD-10-CM | POA: Diagnosis not present

## 2021-12-16 DIAGNOSIS — R202 Paresthesia of skin: Secondary | ICD-10-CM | POA: Diagnosis not present

## 2021-12-16 NOTE — Progress Notes (Signed)
  Radiation Oncology         [phone]) [phone] ________________________________  Name: James Pace MRN: [id number]  Date: 11/05/2021  DOB: 1945/06/03  End of Treatment Note  Diagnosis:    76 y.o. gentleman with Stage T1c adenocarcinoma of the prostate with Gleason Score of 3+4, and PSA of 6.69.     Indication for treatment:  Curative, Definitive Radiotherapy       Radiation treatment dates:   09/28/21 - 11/05/21  Site/dose:   The prostate was treated to 70 Gy in 28 fractions of 2.5 Gy  Beams/energy:   The patient was treated with IMRT using volumetric arc therapy delivering 6 MV X-rays to clockwise and counterclockwise circumferential arcs with a 90 degree collimator offset to avoid dose scalloping.  Image guidance was performed with daily cone beam CT prior to each fraction to align to gold markers in the prostate and assure proper bladder and rectal fill volumes.  Immobilization was achieved with BodyFix custom mold.  Narrative: The patient tolerated radiation treatment relatively well with only minor urinary irritation and modest fatigue.  He did report increased frequency, nocturia and dysuria which were improved with Flomax daily.  He also experienced some constipation that was managed with MiraLAX as needed.  Plan: The patient has completed radiation treatment. He will return to radiation oncology clinic for routine followup in one month. I advised him to call or return sooner if he has any questions or concerns related to his recovery or treatment. ________________________________  Sheral Apley. Tammi Klippel, M.D.

## 2021-12-16 NOTE — Progress Notes (Signed)
Radiation Oncology         [phone] ________________________________  Name: James Pace MRN: [id number]  Date: 12/21/2021  DOB: 08/28/1945  Post Treatment Note  CC: Hulan Fess, MD  [name], MD  Diagnosis:   76 y.o. gentleman with Stage T1c adenocarcinoma of the prostate with Gleason Score of 3+4, and PSA of 6.69.     Interval Since Last Radiation:  6.5 weeks   09/28/21 - 11/05/21: The prostate was treated to 70 Gy in 28 fractions of 2.5 Gy  Narrative:  I spoke with the patient to conduct his routine scheduled 1 month follow up visit via telephone to spare the patient unnecessary potential exposure in the healthcare setting during the current COVID-19 pandemic.  The patient was notified in advance and gave permission to proceed with this visit format.  He tolerated radiation treatment relatively well with only minor urinary irritation and modest fatigue.  He did report increased frequency, nocturia and dysuria which were improved with Flomax daily.  He also experienced some constipation that was managed with MiraLAX as needed.                              On review of systems, the patient states that he is doing very well in general.  His LUTS continue to gradually improve, taking Flomax daily as prescribed.  He specifically denies dysuria, gross hematuria, straining to void, incomplete bladder emptying or incontinence.  He reports a healthy appetite and is maintaining his weight.  He denies abdominal pain, nausea, vomiting, diarrhea or constipation.  His energy level continues to improve and is pretty much back to his baseline at this point.  Overall, he is quite pleased with his progress to date and is excited to [place] this [place].  ALLERGIES:  is allergic to amoxicillin-pot clavulanate and latex.  Meds: Current Outpatient Medications  Medication Sig Dispense Refill   BINAXNOW COVID-19 AG HOME TEST KIT See admin instructions.      ibuprofen (ADVIL) 600 MG tablet Take 600 mg by mouth every 6 (six) hours as needed.     lisinopril (ZESTRIL) 20 MG tablet Take 20 mg by mouth daily.     Omega 3 1200 MG CAPS      S-Adenosylmethionine (SAM-E) 400 MG TBEC Take 1 tablet by mouth daily.     simvastatin (ZOCOR) 40 MG tablet Take 1 tablet by mouth daily.     tamsulosin (FLOMAX) 0.4 MG CAPS capsule Take 1 capsule (0.4 mg total) by mouth daily after supper. 30 capsule 5   No current facility-administered medications for this visit.    Physical Findings:  vitals were not taken for this visit.   /10 Unable to assess due to telephone follow-up visit format.  Lab Findings: Lab Results  Component Value Date   HGB 15.6 09/07/2021   HCT 46.0 09/07/2021     Radiographic Findings: No results found.  Impression/Plan: 1. 76 y.o. gentleman with Stage T1c adenocarcinoma of the prostate with Gleason Score of 3+4, and PSA of 6.69.    He will continue to follow up with urology for ongoing PSA determinations and had a recent follow-up appointment with Dr. Junious Silk on 12/08/2021.  He reports having a PSA drawn at that time but is unaware of the results to date.  His next scheduled follow-up will be in 6 months with a PSA prior to that visit.  He understands  what to expect with regards to PSA monitoring going forward.  He has continued taking Flomax daily as prescribed.  I will look forward to following his response to treatment via correspondence with urology, and would be happy to continue to participate in his care if clinically indicated. I talked to the patient about what to expect in the future, including his risk for erectile dysfunction and rectal bleeding. I encouraged him to call or return to the office if he has any questions regarding his previous radiation or possible radiation side effects. He was comfortable with this plan and will follow up as needed.     Nicholos Johns, PA-C

## 2021-12-20 ENCOUNTER — Encounter: Payer: Self-pay | Admitting: Urology

## 2021-12-20 NOTE — Progress Notes (Signed)
Telephone appointment. I verified patient's identity and began nursing interview. No issues reported at this time.  Meaningful use complete. I-PSS score of 0. Flomax as directed. Urology appt- x6 months-pending.  Reminded patient of his 10:30am-12/21/21 telephone appointment w/ Ashlyn Bruning PA-C. I left my extension [phone] in case patient needs anything. Patient verbalized understanding.  Patient contact [phone]

## 2021-12-21 ENCOUNTER — Ambulatory Visit
Admission: RE | Admit: 2021-12-21 | Discharge: 2021-12-21 | Disposition: A | Discharge status: Home / Self Care | Payer: PPO | Source: Ambulatory Visit | Attending: Urology | Admitting: Urology

## 2021-12-21 DIAGNOSIS — C61 Malignant neoplasm of prostate: Secondary | ICD-10-CM

## 2021-12-22 ENCOUNTER — Ambulatory Visit: Payer: Self-pay | Admitting: Urology

## 2021-12-27 NOTE — Progress Notes (Signed)
RN left voicemail for call back regarding follow up with PSA results from [facility].

## 2022-01-25 ENCOUNTER — Encounter: Payer: Self-pay | Admitting: *Deleted

## 2022-04-13 ENCOUNTER — Other Ambulatory Visit: Payer: Self-pay | Admitting: Radiation Oncology

## 2022-04-22 DIAGNOSIS — H43812 Vitreous degeneration, left eye: Secondary | ICD-10-CM | POA: Diagnosis not present

## 2022-04-22 DIAGNOSIS — H5203 Hypermetropia, bilateral: Secondary | ICD-10-CM | POA: Diagnosis not present

## 2022-04-22 DIAGNOSIS — H2513 Age-related nuclear cataract, bilateral: Secondary | ICD-10-CM | POA: Diagnosis not present

## 2022-04-22 DIAGNOSIS — E119 Type 2 diabetes mellitus without complications: Secondary | ICD-10-CM | POA: Diagnosis not present

## 2022-04-22 DIAGNOSIS — H524 Presbyopia: Secondary | ICD-10-CM | POA: Diagnosis not present

## 2022-05-03 ENCOUNTER — Encounter: Payer: Self-pay | Admitting: *Deleted

## 2022-05-17 ENCOUNTER — Inpatient Hospital Stay: Payer: PPO | Attending: Adult Health | Admitting: *Deleted

## 2022-05-17 DIAGNOSIS — C61 Malignant neoplasm of prostate: Secondary | ICD-10-CM

## 2022-05-17 NOTE — Progress Notes (Signed)
Survivorship Care visit reviewed and completed.

## 2022-07-05 DIAGNOSIS — C61 Malignant neoplasm of prostate: Secondary | ICD-10-CM | POA: Diagnosis not present

## 2022-07-14 DIAGNOSIS — N401 Enlarged prostate with lower urinary tract symptoms: Secondary | ICD-10-CM | POA: Diagnosis not present

## 2022-07-14 DIAGNOSIS — R3912 Poor urinary stream: Secondary | ICD-10-CM | POA: Diagnosis not present

## 2022-07-14 DIAGNOSIS — C61 Malignant neoplasm of prostate: Secondary | ICD-10-CM | POA: Diagnosis not present

## 2022-09-13 DIAGNOSIS — X32XXXD Exposure to sunlight, subsequent encounter: Secondary | ICD-10-CM | POA: Diagnosis not present

## 2022-09-13 DIAGNOSIS — Z1283 Encounter for screening for malignant neoplasm of skin: Secondary | ICD-10-CM | POA: Diagnosis not present

## 2022-09-13 DIAGNOSIS — L870 Keratosis follicularis et parafollicularis in cutem penetrans: Secondary | ICD-10-CM | POA: Diagnosis not present

## 2022-09-13 DIAGNOSIS — D225 Melanocytic nevi of trunk: Secondary | ICD-10-CM | POA: Diagnosis not present

## 2022-12-23 DIAGNOSIS — I10 Essential (primary) hypertension: Secondary | ICD-10-CM | POA: Diagnosis not present

## 2022-12-23 DIAGNOSIS — R39198 Other difficulties with micturition: Secondary | ICD-10-CM | POA: Diagnosis not present

## 2022-12-23 DIAGNOSIS — Z23 Encounter for immunization: Secondary | ICD-10-CM | POA: Diagnosis not present

## 2022-12-23 DIAGNOSIS — E7800 Pure hypercholesterolemia, unspecified: Secondary | ICD-10-CM | POA: Diagnosis not present

## 2022-12-23 DIAGNOSIS — C61 Malignant neoplasm of prostate: Secondary | ICD-10-CM | POA: Diagnosis not present

## 2022-12-23 DIAGNOSIS — Z0000 Encounter for general adult medical examination without abnormal findings: Secondary | ICD-10-CM | POA: Diagnosis not present

## 2022-12-23 DIAGNOSIS — R7301 Impaired fasting glucose: Secondary | ICD-10-CM | POA: Diagnosis not present

## 2022-12-23 DIAGNOSIS — R202 Paresthesia of skin: Secondary | ICD-10-CM | POA: Diagnosis not present

## 2022-12-23 DIAGNOSIS — M549 Dorsalgia, unspecified: Secondary | ICD-10-CM | POA: Diagnosis not present

## 2023-01-13 DIAGNOSIS — C61 Malignant neoplasm of prostate: Secondary | ICD-10-CM | POA: Diagnosis not present

## 2023-01-26 DIAGNOSIS — R3912 Poor urinary stream: Secondary | ICD-10-CM | POA: Diagnosis not present

## 2023-01-26 DIAGNOSIS — Z8546 Personal history of malignant neoplasm of prostate: Secondary | ICD-10-CM | POA: Diagnosis not present

## 2023-01-26 DIAGNOSIS — N401 Enlarged prostate with lower urinary tract symptoms: Secondary | ICD-10-CM | POA: Diagnosis not present

## 2023-05-23 DIAGNOSIS — M9903 Segmental and somatic dysfunction of lumbar region: Secondary | ICD-10-CM | POA: Diagnosis not present

## 2023-05-23 DIAGNOSIS — M9901 Segmental and somatic dysfunction of cervical region: Secondary | ICD-10-CM | POA: Diagnosis not present

## 2023-05-23 DIAGNOSIS — M9904 Segmental and somatic dysfunction of sacral region: Secondary | ICD-10-CM | POA: Diagnosis not present

## 2023-05-23 DIAGNOSIS — M9902 Segmental and somatic dysfunction of thoracic region: Secondary | ICD-10-CM | POA: Diagnosis not present

## 2023-06-06 DIAGNOSIS — M9903 Segmental and somatic dysfunction of lumbar region: Secondary | ICD-10-CM | POA: Diagnosis not present

## 2023-06-06 DIAGNOSIS — M9901 Segmental and somatic dysfunction of cervical region: Secondary | ICD-10-CM | POA: Diagnosis not present

## 2023-06-06 DIAGNOSIS — M9904 Segmental and somatic dysfunction of sacral region: Secondary | ICD-10-CM | POA: Diagnosis not present

## 2023-06-06 DIAGNOSIS — M9902 Segmental and somatic dysfunction of thoracic region: Secondary | ICD-10-CM | POA: Diagnosis not present

## 2023-07-04 DIAGNOSIS — M4316 Spondylolisthesis, lumbar region: Secondary | ICD-10-CM | POA: Diagnosis not present

## 2023-07-04 DIAGNOSIS — M9903 Segmental and somatic dysfunction of lumbar region: Secondary | ICD-10-CM | POA: Diagnosis not present

## 2023-07-04 DIAGNOSIS — M62830 Muscle spasm of back: Secondary | ICD-10-CM | POA: Diagnosis not present

## 2023-07-04 DIAGNOSIS — M9904 Segmental and somatic dysfunction of sacral region: Secondary | ICD-10-CM | POA: Diagnosis not present

## 2023-08-08 DIAGNOSIS — M9904 Segmental and somatic dysfunction of sacral region: Secondary | ICD-10-CM | POA: Diagnosis not present

## 2023-08-08 DIAGNOSIS — M9903 Segmental and somatic dysfunction of lumbar region: Secondary | ICD-10-CM | POA: Diagnosis not present

## 2023-08-08 DIAGNOSIS — M62830 Muscle spasm of back: Secondary | ICD-10-CM | POA: Diagnosis not present

## 2023-08-08 DIAGNOSIS — M4316 Spondylolisthesis, lumbar region: Secondary | ICD-10-CM | POA: Diagnosis not present

## 2023-09-05 DIAGNOSIS — M62830 Muscle spasm of back: Secondary | ICD-10-CM | POA: Diagnosis not present

## 2023-09-05 DIAGNOSIS — M4316 Spondylolisthesis, lumbar region: Secondary | ICD-10-CM | POA: Diagnosis not present

## 2023-09-05 DIAGNOSIS — M9903 Segmental and somatic dysfunction of lumbar region: Secondary | ICD-10-CM | POA: Diagnosis not present

## 2023-09-05 DIAGNOSIS — M9904 Segmental and somatic dysfunction of sacral region: Secondary | ICD-10-CM | POA: Diagnosis not present

## 2023-10-03 DIAGNOSIS — M9904 Segmental and somatic dysfunction of sacral region: Secondary | ICD-10-CM | POA: Diagnosis not present

## 2023-10-03 DIAGNOSIS — M9903 Segmental and somatic dysfunction of lumbar region: Secondary | ICD-10-CM | POA: Diagnosis not present

## 2023-10-03 DIAGNOSIS — M4316 Spondylolisthesis, lumbar region: Secondary | ICD-10-CM | POA: Diagnosis not present

## 2023-10-03 DIAGNOSIS — M62830 Muscle spasm of back: Secondary | ICD-10-CM | POA: Diagnosis not present

## 2023-11-07 DIAGNOSIS — M9903 Segmental and somatic dysfunction of lumbar region: Secondary | ICD-10-CM | POA: Diagnosis not present

## 2023-11-07 DIAGNOSIS — M4316 Spondylolisthesis, lumbar region: Secondary | ICD-10-CM | POA: Diagnosis not present

## 2023-11-07 DIAGNOSIS — M62830 Muscle spasm of back: Secondary | ICD-10-CM | POA: Diagnosis not present

## 2023-11-07 DIAGNOSIS — M9904 Segmental and somatic dysfunction of sacral region: Secondary | ICD-10-CM | POA: Diagnosis not present

## 2023-12-05 DIAGNOSIS — M9904 Segmental and somatic dysfunction of sacral region: Secondary | ICD-10-CM | POA: Diagnosis not present

## 2023-12-05 DIAGNOSIS — M4316 Spondylolisthesis, lumbar region: Secondary | ICD-10-CM | POA: Diagnosis not present

## 2023-12-05 DIAGNOSIS — M62830 Muscle spasm of back: Secondary | ICD-10-CM | POA: Diagnosis not present

## 2023-12-05 DIAGNOSIS — M9903 Segmental and somatic dysfunction of lumbar region: Secondary | ICD-10-CM | POA: Diagnosis not present

## 2024-01-03 DIAGNOSIS — C61 Malignant neoplasm of prostate: Secondary | ICD-10-CM | POA: Diagnosis not present

## 2024-01-03 DIAGNOSIS — I10 Essential (primary) hypertension: Secondary | ICD-10-CM | POA: Diagnosis not present

## 2024-01-03 DIAGNOSIS — Z23 Encounter for immunization: Secondary | ICD-10-CM | POA: Diagnosis not present

## 2024-01-03 DIAGNOSIS — R7301 Impaired fasting glucose: Secondary | ICD-10-CM | POA: Diagnosis not present

## 2024-01-03 DIAGNOSIS — Z0000 Encounter for general adult medical examination without abnormal findings: Secondary | ICD-10-CM | POA: Diagnosis not present

## 2024-01-03 DIAGNOSIS — E7800 Pure hypercholesterolemia, unspecified: Secondary | ICD-10-CM | POA: Diagnosis not present

## 2024-01-03 DIAGNOSIS — R202 Paresthesia of skin: Secondary | ICD-10-CM | POA: Diagnosis not present

## 2024-01-09 DIAGNOSIS — M62830 Muscle spasm of back: Secondary | ICD-10-CM | POA: Diagnosis not present

## 2024-01-09 DIAGNOSIS — M4316 Spondylolisthesis, lumbar region: Secondary | ICD-10-CM | POA: Diagnosis not present

## 2024-01-09 DIAGNOSIS — M9903 Segmental and somatic dysfunction of lumbar region: Secondary | ICD-10-CM | POA: Diagnosis not present

## 2024-01-09 DIAGNOSIS — M9904 Segmental and somatic dysfunction of sacral region: Secondary | ICD-10-CM | POA: Diagnosis not present

## 2024-01-22 DIAGNOSIS — Z8546 Personal history of malignant neoplasm of prostate: Secondary | ICD-10-CM | POA: Diagnosis not present

## 2024-01-29 DIAGNOSIS — N401 Enlarged prostate with lower urinary tract symptoms: Secondary | ICD-10-CM | POA: Diagnosis not present

## 2024-01-29 DIAGNOSIS — R3912 Poor urinary stream: Secondary | ICD-10-CM | POA: Diagnosis not present

## 2024-01-29 DIAGNOSIS — R399 Unspecified symptoms and signs involving the genitourinary system: Secondary | ICD-10-CM | POA: Diagnosis not present

## 2024-02-13 DIAGNOSIS — M9906 Segmental and somatic dysfunction of lower extremity: Secondary | ICD-10-CM | POA: Diagnosis not present

## 2024-02-13 DIAGNOSIS — M9905 Segmental and somatic dysfunction of pelvic region: Secondary | ICD-10-CM | POA: Diagnosis not present

## 2024-02-13 DIAGNOSIS — M9904 Segmental and somatic dysfunction of sacral region: Secondary | ICD-10-CM | POA: Diagnosis not present

## 2024-02-13 DIAGNOSIS — M9902 Segmental and somatic dysfunction of thoracic region: Secondary | ICD-10-CM | POA: Diagnosis not present

## 2024-02-13 DIAGNOSIS — M9901 Segmental and somatic dysfunction of cervical region: Secondary | ICD-10-CM | POA: Diagnosis not present

## 2024-02-13 DIAGNOSIS — M9903 Segmental and somatic dysfunction of lumbar region: Secondary | ICD-10-CM | POA: Diagnosis not present

## 2024-03-05 DIAGNOSIS — M9905 Segmental and somatic dysfunction of pelvic region: Secondary | ICD-10-CM | POA: Diagnosis not present

## 2024-03-05 DIAGNOSIS — M9902 Segmental and somatic dysfunction of thoracic region: Secondary | ICD-10-CM | POA: Diagnosis not present

## 2024-03-05 DIAGNOSIS — M9903 Segmental and somatic dysfunction of lumbar region: Secondary | ICD-10-CM | POA: Diagnosis not present

## 2024-03-05 DIAGNOSIS — M9904 Segmental and somatic dysfunction of sacral region: Secondary | ICD-10-CM | POA: Diagnosis not present

## 2024-03-05 DIAGNOSIS — M9906 Segmental and somatic dysfunction of lower extremity: Secondary | ICD-10-CM | POA: Diagnosis not present

## 2024-03-05 DIAGNOSIS — M9901 Segmental and somatic dysfunction of cervical region: Secondary | ICD-10-CM | POA: Diagnosis not present

## 2024-03-08 NOTE — Progress Notes (Addendum)
 James Pace                                          MRN: [id number]   04/12/2024   The [facility] Quality Team Specialist reviewed this patient medical record for the purposes of chart review for care gap closure. The following were reviewed: chart review for care gap closure-kidney health evaluation for diabetes:eGFR  and uACR.    [facility] Quality Team
